# Patient Record
Sex: Female | Born: 1985
Health system: Southern US, Community
[De-identification: ages and names within clinical notes are randomized; demographics above are authoritative.]

## PROBLEM LIST (undated history)

## (undated) DIAGNOSIS — Z720 Tobacco use: Secondary | ICD-10-CM

## (undated) DIAGNOSIS — L509 Urticaria, unspecified: Secondary | ICD-10-CM

## (undated) DIAGNOSIS — D649 Anemia, unspecified: Secondary | ICD-10-CM

## (undated) DIAGNOSIS — F419 Anxiety disorder, unspecified: Secondary | ICD-10-CM

## (undated) HISTORY — DX: Tobacco use: Z72.0

## (undated) HISTORY — DX: Anemia, unspecified: D64.9

## (undated) HISTORY — PX: TYMPANOSTOMY TUBE PLACEMENT: SHX32

## (undated) HISTORY — PX: KNEE SURGERY: SHX244

## (undated) HISTORY — DX: Urticaria, unspecified: L50.9

## (undated) HISTORY — DX: Anxiety disorder, unspecified: F41.9

---

## 2004-10-04 ENCOUNTER — Emergency Department (HOSPITAL_COMMUNITY): Admission: EM | Admit: 2004-10-04 | Discharge: 2004-10-04 | Payer: Self-pay | Admitting: Emergency Medicine

## 2004-11-02 ENCOUNTER — Ambulatory Visit (HOSPITAL_COMMUNITY): Admission: RE | Admit: 2004-11-02 | Discharge: 2004-11-02 | Payer: Self-pay | Admitting: Orthopedic Surgery

## 2004-11-17 ENCOUNTER — Encounter: Admission: RE | Admit: 2004-11-17 | Discharge: 2004-12-08 | Payer: Self-pay | Admitting: Occupational Medicine

## 2005-02-12 ENCOUNTER — Encounter
Admission: RE | Admit: 2005-02-12 | Discharge: 2005-05-13 | Payer: Self-pay | Admitting: Physical Medicine & Rehabilitation

## 2005-02-12 ENCOUNTER — Ambulatory Visit: Payer: Self-pay | Admitting: Physical Medicine & Rehabilitation

## 2005-02-16 ENCOUNTER — Ambulatory Visit: Payer: Self-pay | Admitting: Physical Medicine & Rehabilitation

## 2005-03-20 ENCOUNTER — Ambulatory Visit: Payer: Self-pay | Admitting: Physical Medicine & Rehabilitation

## 2005-05-06 ENCOUNTER — Ambulatory Visit: Payer: Self-pay | Admitting: Physical Medicine & Rehabilitation

## 2005-07-06 ENCOUNTER — Encounter: Admission: RE | Admit: 2005-07-06 | Discharge: 2005-08-03 | Payer: Self-pay | Admitting: Orthopedic Surgery

## 2006-07-02 ENCOUNTER — Ambulatory Visit: Payer: Self-pay | Admitting: Internal Medicine

## 2006-07-02 ENCOUNTER — Ambulatory Visit (HOSPITAL_COMMUNITY): Admission: RE | Admit: 2006-07-02 | Discharge: 2006-07-02 | Payer: Self-pay | Admitting: Internal Medicine

## 2006-07-02 ENCOUNTER — Encounter (INDEPENDENT_AMBULATORY_CARE_PROVIDER_SITE_OTHER): Payer: Self-pay | Admitting: *Deleted

## 2006-07-02 DIAGNOSIS — R059 Cough, unspecified: Secondary | ICD-10-CM | POA: Insufficient documentation

## 2006-07-02 DIAGNOSIS — R930 Abnormal findings on diagnostic imaging of skull and head, not elsewhere classified: Secondary | ICD-10-CM | POA: Insufficient documentation

## 2006-07-02 DIAGNOSIS — Z87891 Personal history of nicotine dependence: Secondary | ICD-10-CM | POA: Insufficient documentation

## 2006-07-02 DIAGNOSIS — R05 Cough: Secondary | ICD-10-CM

## 2006-07-02 LAB — CONVERTED CEMR LAB: Influenza B Ag: NEGATIVE

## 2006-07-28 ENCOUNTER — Emergency Department (HOSPITAL_COMMUNITY): Admission: EM | Admit: 2006-07-28 | Discharge: 2006-07-28 | Payer: Self-pay | Admitting: Emergency Medicine

## 2006-08-03 ENCOUNTER — Inpatient Hospital Stay (HOSPITAL_COMMUNITY): Admission: AD | Admit: 2006-08-03 | Discharge: 2006-08-03 | Payer: Self-pay | Admitting: Obstetrics & Gynecology

## 2006-08-12 ENCOUNTER — Inpatient Hospital Stay (HOSPITAL_COMMUNITY): Admission: AD | Admit: 2006-08-12 | Discharge: 2006-08-12 | Payer: Self-pay | Admitting: Obstetrics & Gynecology

## 2006-09-29 ENCOUNTER — Inpatient Hospital Stay (HOSPITAL_COMMUNITY): Admission: AD | Admit: 2006-09-29 | Discharge: 2006-09-29 | Payer: Self-pay | Admitting: Obstetrics & Gynecology

## 2006-10-21 ENCOUNTER — Ambulatory Visit (HOSPITAL_COMMUNITY): Admission: RE | Admit: 2006-10-21 | Discharge: 2006-10-21 | Payer: Self-pay | Admitting: Obstetrics

## 2006-11-01 ENCOUNTER — Inpatient Hospital Stay (HOSPITAL_COMMUNITY): Admission: AD | Admit: 2006-11-01 | Discharge: 2006-11-01 | Payer: Self-pay | Admitting: Obstetrics

## 2006-12-17 ENCOUNTER — Inpatient Hospital Stay (HOSPITAL_COMMUNITY): Admission: AD | Admit: 2006-12-17 | Discharge: 2006-12-17 | Payer: Self-pay | Admitting: Obstetrics

## 2007-01-18 ENCOUNTER — Inpatient Hospital Stay (HOSPITAL_COMMUNITY): Admission: AD | Admit: 2007-01-18 | Discharge: 2007-01-18 | Payer: Self-pay | Admitting: Obstetrics

## 2007-02-07 ENCOUNTER — Inpatient Hospital Stay (HOSPITAL_COMMUNITY): Admission: AD | Admit: 2007-02-07 | Discharge: 2007-02-07 | Payer: Self-pay | Admitting: Obstetrics

## 2007-02-14 ENCOUNTER — Inpatient Hospital Stay (HOSPITAL_COMMUNITY): Admission: AD | Admit: 2007-02-14 | Discharge: 2007-02-14 | Payer: Self-pay | Admitting: Obstetrics & Gynecology

## 2007-03-19 ENCOUNTER — Inpatient Hospital Stay (HOSPITAL_COMMUNITY): Admission: AD | Admit: 2007-03-19 | Discharge: 2007-03-23 | Payer: Self-pay | Admitting: Obstetrics

## 2007-03-20 ENCOUNTER — Encounter (INDEPENDENT_AMBULATORY_CARE_PROVIDER_SITE_OTHER): Payer: Self-pay | Admitting: Obstetrics

## 2007-07-01 ENCOUNTER — Encounter (INDEPENDENT_AMBULATORY_CARE_PROVIDER_SITE_OTHER): Payer: Self-pay | Admitting: *Deleted

## 2007-07-01 ENCOUNTER — Ambulatory Visit (HOSPITAL_COMMUNITY): Admission: RE | Admit: 2007-07-01 | Discharge: 2007-07-01 | Payer: Self-pay | Admitting: Obstetrics

## 2009-02-08 ENCOUNTER — Emergency Department (HOSPITAL_COMMUNITY): Admission: EM | Admit: 2009-02-08 | Discharge: 2009-02-08 | Payer: Self-pay | Admitting: Family Medicine

## 2009-09-03 ENCOUNTER — Encounter: Payer: Self-pay | Admitting: Cardiovascular Disease

## 2009-09-06 ENCOUNTER — Ambulatory Visit (HOSPITAL_COMMUNITY): Admission: RE | Admit: 2009-09-06 | Discharge: 2009-09-06 | Payer: Self-pay | Admitting: Family Medicine

## 2009-09-06 ENCOUNTER — Encounter: Payer: Self-pay | Admitting: Cardiovascular Disease

## 2009-09-07 ENCOUNTER — Emergency Department (HOSPITAL_BASED_OUTPATIENT_CLINIC_OR_DEPARTMENT_OTHER): Admission: EM | Admit: 2009-09-07 | Discharge: 2009-09-07 | Payer: Self-pay | Admitting: Emergency Medicine

## 2009-09-13 ENCOUNTER — Encounter: Payer: Self-pay | Admitting: Cardiovascular Disease

## 2009-09-17 ENCOUNTER — Ambulatory Visit (HOSPITAL_COMMUNITY): Admission: RE | Admit: 2009-09-17 | Discharge: 2009-09-17 | Payer: Self-pay | Admitting: Family Medicine

## 2009-09-19 ENCOUNTER — Encounter: Payer: Self-pay | Admitting: Cardiovascular Disease

## 2009-09-24 ENCOUNTER — Encounter (INDEPENDENT_AMBULATORY_CARE_PROVIDER_SITE_OTHER): Payer: Self-pay | Admitting: *Deleted

## 2009-09-30 ENCOUNTER — Emergency Department (HOSPITAL_COMMUNITY): Admission: EM | Admit: 2009-09-30 | Discharge: 2009-09-30 | Payer: Self-pay | Admitting: Family Medicine

## 2009-10-15 ENCOUNTER — Ambulatory Visit: Payer: Self-pay | Admitting: Cardiovascular Disease

## 2009-10-15 DIAGNOSIS — I080 Rheumatic disorders of both mitral and aortic valves: Secondary | ICD-10-CM | POA: Insufficient documentation

## 2009-10-16 ENCOUNTER — Telehealth: Payer: Self-pay | Admitting: Cardiovascular Disease

## 2009-11-04 ENCOUNTER — Telehealth: Payer: Self-pay | Admitting: Cardiovascular Disease

## 2009-11-04 ENCOUNTER — Encounter: Payer: Self-pay | Admitting: Cardiovascular Disease

## 2009-11-26 DIAGNOSIS — R109 Unspecified abdominal pain: Secondary | ICD-10-CM | POA: Insufficient documentation

## 2009-11-28 ENCOUNTER — Ambulatory Visit: Payer: Self-pay | Admitting: Internal Medicine

## 2009-11-28 LAB — CONVERTED CEMR LAB
ALT: 11 units/L (ref 0–35)
Amylase: 23 units/L — ABNORMAL LOW (ref 27–131)
Lipase: 8 units/L — ABNORMAL LOW (ref 11.0–59.0)
Total Bilirubin: 0.6 mg/dL (ref 0.3–1.2)

## 2009-12-12 ENCOUNTER — Encounter (INDEPENDENT_AMBULATORY_CARE_PROVIDER_SITE_OTHER): Payer: Self-pay | Admitting: *Deleted

## 2009-12-12 ENCOUNTER — Ambulatory Visit: Payer: Self-pay | Admitting: Internal Medicine

## 2009-12-12 ENCOUNTER — Telehealth: Payer: Self-pay | Admitting: Internal Medicine

## 2009-12-12 ENCOUNTER — Ambulatory Visit (HOSPITAL_COMMUNITY): Admission: RE | Admit: 2009-12-12 | Discharge: 2009-12-12 | Payer: Self-pay | Admitting: Internal Medicine

## 2009-12-12 LAB — CONVERTED CEMR LAB
Albumin: 4 g/dL (ref 3.5–5.2)
Amylase: 36 units/L (ref 27–131)
Bilirubin, Direct: 0.1 mg/dL (ref 0.0–0.3)
Lipase: 37 units/L (ref 11.0–59.0)
Total Protein: 7 g/dL (ref 6.0–8.3)

## 2010-06-10 NOTE — Letter (Signed)
Summary: [facility] Office Note  [facility] Office Note   Imported By: Roderic Ovens 10/30/2009 10:00:29  _____________________________________________________________________  External Attachment:    Type:   Image     Comment:   External Document

## 2010-06-10 NOTE — Letter (Signed)
Summary: [facility] Office Visit Note   [facility] Office Visit Note   Imported By: Roderic Ovens 11/08/2009 11:57:46  _____________________________________________________________________  External Attachment:    Type:   Image     Comment:   External Document

## 2010-06-10 NOTE — Progress Notes (Signed)
Summary: med clearance/**NM  Phone Note Call from Patient Call back at [phone]   Caller: Patient Reason for Call: Talk to Nurse Summary of Call: needs med clearance faxed to Dr Trenton Founds [phone] Initial call taken by: Migdalia Dk,  October 16, 2009 8:13 AM  Follow-up for Phone Call        Spoke with pt. Patient states needs medicine clearance. Dr. Kalman Drape MD  recommends for pt. to take Strattera 30 mg once a day for anxiety. MD would like for Dr. Clifton James to okay medication in writting  on a  prescription  that is [facility] for pt. to take this medication. Note can be fax to [phone]. Ph # [phone].  Pt. would like for med. clearance to be done asap so she can get the medication. Ollen Gross, RN, BSN  October 16, 2009 8:46 AM   Additional Follow-up for Phone Call Additional follow up Details #1::        This should be ok for her to take this medication. Can we fax the note over to the doctors office? thanks, cdm Additional Follow-up by: Verne Carrow, MD,  October 17, 2009 3:02 PM     Appended Document: med clearance/**NM Net was faxed to Dr. Joaquin Bend MD to fax # [phone].

## 2010-06-10 NOTE — Progress Notes (Signed)
Summary: Gallbladder attack  Phone Note Call from Patient Call back at Home Phone [phone]   Call For: DR BRODIE Reason for Call: Talk to Nurse Summary of Call: Had a hida Scan today which caused her to have a gallbladder attack Initial call taken by: Leanor Kail [facility],  December 12, 2009 1:51 PM  Follow-up for Phone Call        Patient with nausea, diarrhea, pain in back, RUQ pain and bloating since having HIDA CCK this am.  Per x-ray report patient had pain during the exam with the CCK.  She has had to leave work due to the pain.    Reviewed with Dr Juanda Chance patient to come for LFT, amylase, lipase.  Patient  can have Vicodin 5/500 1 by mouth q 4 hours as needed pain #20 zero refills.  Left message for patient to call back Follow-up by: Darcey Nora RN, CGRN,  December 12, 2009 2:12 PM  Additional Follow-up for Phone Call Additional follow up Details #1::        patient aware.  She will come for lab this pm, rx faxed to [facility] pharmacy Additional Follow-up by: Darcey Nora RN, CGRN,  December 12, 2009 2:31 PM    New/Updated Medications: HYDROCODONE-ACETAMINOPHEN 5-500 MG TABS (HYDROCODONE-ACETAMINOPHEN) 1 by mouth q 4 hours as needed abdominal pain Prescriptions: HYDROCODONE-ACETAMINOPHEN 5-500 MG TABS (HYDROCODONE-ACETAMINOPHEN) 1 by mouth q 4 hours as needed abdominal pain  #20 x 0   Entered by:   Darcey Nora RN, CGRN   Authorized by:   Hart Carwin MD   Signed by:   Darcey Nora RN, CGRN on 12/12/2009   Method used:   Print then Give to Patient   RxID:   [id number]

## 2010-06-10 NOTE — Assessment & Plan Note (Signed)
Summary: ABD PAIN & SWELLING-ONGOING/YF   History of Present Illness Visit Type: Initial Consult Primary GI MD: Lina Sar MD Primary Provider: Gabriel Cirri, DO Chief Complaint: Abdominal pain, pt states she thinks she is having gallbladder attacks. History of Present Illness:   25 y.o.white female with  3 episodes of severe epigastric and the right costal margin abdominal pain which occurred at night and lasted several hours.all in last 3 months.  Her ultrasound of the gallbladder in April of this year was negative. The common bile duct of 3 mm. Her CT scan of the abdomen and pelvis in February 2009 after a  C-section was normal. She has  a strong family history of gallbladder disease in a  maternal grandmother and 7 paternal uncles and aunts  who had cholecystectomies. Her grandmother just had a HIDA scan which was positive ( but had a normal Ultrasound).. She has no GI history. There has been an intentional 40 pound weight loss in the last 8 months. She had  heartburn during her pregnancy . There is no hx of  dyspepsia or food intolerance   GI Review of Systems    Reports abdominal pain and  bloating.      Denies acid reflux, belching, chest pain, dysphagia with liquids, dysphagia with solids, heartburn, loss of appetite, nausea, vomiting, vomiting blood, weight loss, and  weight gain.        Denies anal fissure, black tarry stools, change in bowel habit, constipation, diarrhea, diverticulosis, fecal incontinence, heme positive stool, hemorrhoids, irritable bowel syndrome, jaundice, light color stool, liver problems, rectal bleeding, and  rectal pain.    Allergies (verified): 1)  ! Buspar  Past History:  Past Medical History: Reviewed history from 10/15/2009 and no changes required. Anxiety Anemia Menstrual dysfunction-heavy menses  Past Surgical History: Reviewed history from 10/15/2009 and no changes required. C section Knee surgery left 2007  Family History: Reviewed  history from 11/26/2009 and no changes required. Significannt for hypertension in both parents DM in maternal grandmother Mother and father alive and healthy 1 sister alive and well 1 brother alive and well 1 sister died an newborn Family History of Heart Disease: Maternal Grandfather Family History of Rectal Cancer: Maternal Grandfather  Social History: Reviewed history from 11/26/2009 and no changes required. Works in [employer] Single, 1 child No tobacco: She used to smoke 4ppd x 10 years, stopped 01/30/09. No alcohol No illicit drug use Daily Caffeine Use: 2 servings daily Patient gets regular exercise.  Review of Systems       The patient complains of anemia and anxiety-new.         Pertinent positive and negative review of systems were noted in the above HPI. All other ROS was otherwise negative.   Vital Signs:  Patient profile:   25 year old female Height:      62 inches Weight:      144 pounds BMI:     26.43 BSA:     1.66 Pulse rate:   80 / minute Pulse rhythm:   irregular BP sitting:   120 / 64  (left arm)  Vitals Entered By: Merri Ray CMA Duncan Dull) (November 28, 2009 9:36 AM)  Physical Exam  General:  Well developed, well nourished, no acute distress. Mouth:  No deformity or lesions, dentition normal. Neck:  Supple; no masses or thyromegaly. Lungs:  Clear throughout to auscultation. Heart:  Regular rate and rhythm; no murmurs, rubs,  or bruits. Abdomen:  soft relaxed abdomen without tenderness  in epigastrium and right upper quadrant. Liver edge at costal margin. No CVA tenderness. Lower abdomen unremarkable. Rectal:  soft Hemoccult negative stool. Extremities:  No clubbing, cyanosis, edema or deformities noted. Skin:  multiple tattoos. Psych:  Alert and cooperative. Normal mood and affect.   Impression & Recommendations:  Problem # 1:  ABDOMINAL PAIN, UNSPECIFIED SITE (ICD-789.00) Patient has had episodes of acute upper abdominal pain suggestive  of biliary colic in the setting of a normal abdominal ultrasound. She has a strong family history of gallbladder disease. We will proceed with a HIDA scan with CCK, liver function tests, amylase and lipase. I am putting her empirically on Prilosec 20 mg daily for suspected gastritis. If the HIDA scan is abnormal, we will refer for surgical consultation. If her HIDA scan is negative, then I would suggest an upper endoscopy and continuation of PPIs. I have instructed her on a low-fat diet. Orders: TLB-Hepatic/Liver Function Pnl (80076-HEPATIC) TLB-Amylase (82150-AMYL) TLB-Lipase (83690-LIPASE) TLB-TSH (Thyroid Stimulating Hormone) (84443-TSH) HIDA CCK (HIDA CCK)  Problem # 2:  Family Hx of RECTAL CANCER (ICD-154.1) Patient has hemoccult-negative stool. She is not a candidate for colonoscopy at this age.  Patient Instructions: 1)  Low-fat diet. 2)  HIDA scan with CCK. 3)  Hepatic function, amylase and lipase,TSH. 4)  If HIDA is negative, we will proceed with an upper endoscopy. 5)  Prilosec 20 mg daily. Samples given for 2 weeks. 6)  Copy sent to : Dr Gabriel Cirri 7)  The medication list was reviewed and reconciled.  All changed / newly prescribed medications were explained.  A complete medication list was provided to the patient / caregiver.

## 2010-06-10 NOTE — Letter (Signed)
Summary: [facility] Office Visit Note   [facility] Office Visit Note   Imported By: Roderic Ovens 11/08/2009 11:56:56  _____________________________________________________________________  External Attachment:    Type:   Image     Comment:   External Document

## 2010-06-10 NOTE — Assessment & Plan Note (Signed)
Summary: np6/ mvp. pt has umr./ gd   Visit Type:  Initial Consult Primary Provider:  Gabriel Cirri, DO  CC:  Hx of mitral valve prolapse.  History of Present Illness: 25 yo WF with history of anxiety who is here today for cardiac evaluation. She was recently on Adderall and had swelling in arms and legs. She was found to have a slight heart murmur and was referred for an echo which showed mild mitral valve prolapse. She has severe anxiety and when having a panic attack, she has chest pressure, SOB and dizziness. No exertional chest pain or SOB.   Current Medications (verified): 1)  Tylenol 325 Mg Tabs (Acetaminophen) .... One Tablet Two Times A Day As Needed For Fever 2)  Ativan 2 Mg Tabs (Lorazepam) .... Take 1 Tablet By Mouth Once A Day  Allergies (verified): No Known Drug Allergies  Past History:  Past Medical History: Anxiety Anemia Menstrual dysfunction-heavy menses  Past Surgical History: C section Knee surgery left 2007  Family History: Significannt for hypertension in both parents DM in grand mother  Mother and father alive and healthy 1 sister alive and well 1 brother alive and well 1 sister died an newborn  Social History: Works in [employer] Single, 1 child No tobacco: She used to smoke 4ppd x 10 years, stopped 01/30/09. No alcohol No illicit drug use  Review of Systems       The patient complains of chest pain, shortness of breath, dizziness, and anxiety.  The patient denies fatigue, malaise, fever, weight gain/loss, vision loss, decreased hearing, hoarseness, palpitations, prolonged cough, wheezing, sleep apnea, coughing up blood, abdominal pain, blood in stool, nausea, vomiting, diarrhea, heartburn, incontinence, blood in urine, muscle weakness, joint pain, leg swelling, rash, skin lesions, headache, fainting, depression, enlarged lymph nodes, easy bruising or bleeding, and environmental allergies.    Vital Signs:  Patient profile:   25 year  old female Height:      62 inches Weight:      150.50 pounds BMI:     27.63 Pulse rate:   74 / minute Pulse rhythm:   regular Resp:     18 per minute BP sitting:   130 / 80  (left arm) Cuff size:   large  Vitals Entered By: Vikki Ports (October 15, 2009 10:51 AM)  Physical Exam  General:  General: Well developed, well nourished, NAD HEENT: OP clear, mucus membranes moist SKIN: warm, dry Neuro: No focal deficits Musculoskeletal: Muscle strength 5/5 all ext Psychiatric: Mood and affect normal Neck: No JVD, no carotid bruits, no thyromegaly, no lymphadenopathy. Lungs:Clear bilaterally, no wheezes, rhonci, crackles CV: RRR with slight systolic  murmur, No gallops rubs Abdomen: soft, NT, ND, BS present Extremities: No edema, pulses 2+.    EKG  Procedure date:  10/15/2009  Findings:      NSR, rate 76 bpm. Normal EKG.   Echocardiogram  Procedure date:  09/17/2009  Findings:      Normal LV size and function. EF 60-65%. Mild mitral regurgitation.   Impression & Recommendations:  Problem # 1:  MITRAL REGURGITATION (ICD-396.3) Mild MR by echo. She has no symptoms that sound cardiac related. Repeat echo one year. Avoid tobacco. F/U one year after echo. I have reassured her about the valvular issue.   Patient Instructions: 1)  Your physician recommends that you schedule a follow-up appointment in: 1 year 2)  Your physician has requested that you have an echocardiogram.  Echocardiography is a painless test that uses  sound waves to create images of your heart. It provides your doctor with information about the size and shape of your heart and how well your heart's chambers and valves are working.  This procedure takes approximately one hour. There are no restrictions for this procedure. To be done in 1 year

## 2010-06-10 NOTE — Progress Notes (Signed)
Summary: pt wants a new Rx asap  Phone Note Call from Patient Call back at Home Phone [phone]   Caller: Patient Reason for Call: Talk to Nurse, Talk to Doctor Summary of Call: pt would like a Rx today for a low dose of aderol Initial call taken by: Omer Jack,  November 04, 2009 10:44 AM  Follow-up for Phone Call        PER PT NEEDS NOTE SAYING OKAY TO TAKE LOW DOSE ADDEROL Scherrie Bateman, LPN  November 04, 2009 11:07 AM  Additional Follow-up for Phone Call Additional follow up Details #1::        I have written a note in the EMR. Can we fax it to her physician? cdm Additional Follow-up by: Verne Carrow, MD,  November 04, 2009 4:11 PM    Additional Follow-up for Phone Call Additional follow up Details #2::    Called pt to see where note should be faxed. Left message to call back. Dossie Arbour, RN, BSN  November 05, 2009 6:04 PM Pt. notified letter has been written. Per pt. note needs to be sent to Dr. Ian Bushman. Fax nimber [phone] Dossie Arbour, RN, BSN  November 06, 2009 8:59 AM Note faxed Follow-up by: Dossie Arbour, RN, BSN,  November 06, 2009 2:11 PM

## 2010-06-10 NOTE — Letter (Signed)
Summary: New Patient letter  [facility]  [address] [place], [place] [address]   Phone: [phone]  Fax: [phone]       09/24/2009 MRN: [id number]  [facility] [address] [place], [place]  [address]  Dear Ms. Charlesetta Shanks,  Welcome to the Gastroenterology Division at [facility].    You are scheduled to see Dr.  Juanda Chance on JULY 21,2011 at 9:15am on the 3rd floor at [facility], [address]  We ask that you try to arrive at our office 15 minutes prior to your appointment time to allow for check-in.  We would like you to complete the enclosed self-administered evaluation form prior to your visit and bring it with you on the day of your appointment.  We will review it with you.  Also, please bring a complete list of all your medications or, if you prefer, bring the medication bottles and we will list them.  Please bring your insurance card so that we may make a copy of it.  If your insurance requires a referral to see a specialist, please bring your referral form from your primary care physician.  Co-payments are due at the time of your visit and may be paid by cash, check or credit card.     Your office visit will consist of a consult with your physician (includes a physical exam), any laboratory testing he/she may order, scheduling of any necessary diagnostic testing (e.g. x-ray, ultrasound, CT-scan), and scheduling of a procedure (e.g. Endoscopy, Colonoscopy) if required.  Please allow enough time on your schedule to allow for any/all of these possibilities.    If you cannot keep your appointment, please call [phone] to cancel or reschedule prior to your appointment date.  This allows [place] the opportunity to schedule an appointment for another patient in need of care.  If you do not cancel or reschedule by 5 p.m. the business day prior to your appointment date, you will be charged a $50.00 late cancellation/no-show fee.    Thank you for choosing  [facility] for your medical needs.  We appreciate the opportunity to care for you.  Please visit [place] at our website  to learn more about our practice.                     Sincerely,                                                             The Gastroenterology Division

## 2010-06-10 NOTE — Letter (Signed)
Summary: [facility] Office Note  [facility] Office Note   Imported By: Roderic Ovens 10/30/2009 09:59:50  _____________________________________________________________________  External Attachment:    Type:   Image     Comment:   External Document

## 2010-06-10 NOTE — Letter (Signed)
Summary: [facility] Office Visit Note   [facility] Office Visit Note   Imported By: Roderic Ovens 11/08/2009 11:57:28  _____________________________________________________________________  External Attachment:    Type:   Image     Comment:   External Document

## 2010-06-10 NOTE — Letter (Signed)
Summary: [facility] Office Visit Note   [facility] Office Visit Note   Imported By: Roderic Ovens 12/06/2009 15:51:47  _____________________________________________________________________  External Attachment:    Type:   Image     Comment:   External Document

## 2010-06-10 NOTE — Letter (Signed)
Summary: [facility] Office Note  [facility] Office Note   Imported By: Roderic Ovens 10/30/2009 10:01:26  _____________________________________________________________________  External Attachment:    Type:   Image     Comment:   External Document

## 2010-06-10 NOTE — Letter (Signed)
Summary: Generic Letter  [facility], Main Office  [address]   [place], [place] [address]   Phone: [phone]  Fax: [phone]        November 04, 2009 MRN: [id number]    [facility] [address] [place], [place]  [address]    Tanya Powers was seen recently in our office. She has mild mitral valve prolapse on echocardiogram with no other cardiac abnormalities. There is no cardiac contraindication to the use of Adderall in this patient.    Sincerely,  Verne Carrow, MD  This letter has been electronically signed by your physician.

## 2010-07-29 LAB — POCT CARDIAC MARKERS
CKMB, poc: <1 ng/mL — ABNORMAL LOW (ref 1.0–8.0)
Myoglobin, poc: 37.3 ng/mL (ref 12–200)

## 2010-07-29 LAB — CBC
RBC: 5.25 MIL/uL — ABNORMAL HIGH (ref 3.87–5.11)
WBC: 8.8 10*3/uL (ref 4.0–10.5)

## 2010-07-29 LAB — URINALYSIS, ROUTINE W REFLEX MICROSCOPIC
Nitrite: NEGATIVE
Specific Gravity, Urine: 1.012 (ref 1.005–1.030)
pH: 6 (ref 5.0–8.0)

## 2010-07-29 LAB — PREGNANCY, URINE: Preg Test, Ur: NEGATIVE

## 2010-07-29 LAB — BASIC METABOLIC PANEL
Calcium: 9 mg/dL (ref 8.4–10.5)
Chloride: 104 mEq/L (ref 96–112)
Creatinine, Ser: 0.6 mg/dL (ref 0.4–1.2)
GFR calc Af Amer: >60 mL/min (ref >60)
GFR calc non Af Amer: >60 mL/min (ref >60)

## 2010-09-23 NOTE — H&P (Signed)
NAMEALLORA, BAINS             ACCOUNT NO.:  [id number]   MEDICAL RECORD NO.:  [id number]          PATIENT TYPE:  INP   LOCATION:  9320                          FACILITY:  [facility]   PHYSICIAN:  Kathreen Cosier, M.D.DATE OF BIRTH:  May 20, 1985   DATE OF ADMISSION:  03/19/2007  DATE OF DISCHARGE:                              HISTORY & PHYSICAL   The patient is a 25 year old, gravida 1, EDC March 22, 2007, who was  seen at 4:30 a.m. in labor.  Initially, she had some elevated blood  pressures and her PIH labs were normal and by the time she was in labor  and delivery her blood pressure was normal.  Her cervix was 1-cm, 90%,  and the vertex was at a minus 3 station.  Amniotomy was performed.  No  fluid was obtained.  Thick meconium was noted and it was noted that she  was having some spontaneous variable decels which recovered rapidly.  An  IUPC was inserted and she was contracting every 3 minutes.  She was then  examined by the nurse at 5:18 a.m. and was 2-cm, 90%, with a vertex  minus 3.  Next exam was at 8:15 a.m. and at that time the nurse examined  the patient she was fully dilated with a breech presentation at a minus  1 station.  Throughout the morning she had occasional variable decels  and an otherwise reactive tracing.  She had amnio infusion begun at 5:33  a.m. because of meconium.  It was decided that the patient would deliver  by C-section because of breech presentation and in labor.   PHYSICAL EXAMINATION:  GENERAL:  Revealed a well developed female in  labor.  HEENT:  Negative.  LUNGS:  Clear.  HEART:  Regular rhythm.  No murmurs or gallops.  BREASTS:  No masses.  ABDOMEN:  Term size with an estimated fetal weight of 6 pounds and 12  ounces.  LEGS:  Negative.           ______________________________  Kathreen Cosier, M.D.     BAM/MEDQ  D:  03/20/2007  T:  03/20/2007  Job:  [id number]

## 2010-09-23 NOTE — Op Note (Signed)
Tanya Powers, Tanya Powers             ACCOUNT NO.:  [id number]   MEDICAL RECORD NO.:  [id number]          PATIENT TYPE:  INP   LOCATION:  9320                          FACILITY:  [facility]   PHYSICIAN:  Kathreen Cosier, M.D.DATE OF BIRTH:  03-24-1986   DATE OF PROCEDURE:  03/20/2007  DATE OF DISCHARGE:                               OPERATIVE REPORT   PREOPERATIVE DIAGNOSIS:  Breech presentation, fully dilated at term.   POSTOPERATIVE DIAGNOSIS:  Breech presentation, fully dilated at term,  intrauterine growth retardation.   ANESTHESIA:  Epidural.   SURGEON:  Kathreen Cosier, M.D.   PROCEDURE:  The patient placed on the operating table in supine  position.  Abdomen prepped and draped.  Bladder emptied with Foley  catheter.  Transverse suprapubic incision made, carried down to rectus  fascia.  Fascia cleaned and incised the length of incision.  Recti  muscles retracted laterally.  Peritoneum incised longitudinally.  Transverse incision made in the visceral peritoneum above the bladder.  Bladder mobilized inferiorly.  Transverse lower uterine incision made  and the patient delivered of a frank breech female.  There was a loose  cord around the right calf and the team was in attendance.  Apgar scores  were 2, 6, 7 and the cord pH was 7.23 and the baby weight 4 pounds 8  ounces.  The placenta was anterior, removed manually and sent to  pathology.  The uterine cavity cleaned with dry laps.  The uterine  incision closed with continuous suture of #1 chromic in one layer.  Hemostasis satisfactory.  Bladder flap reattached with 2-0 chromic.  Uterus well contracted.  Tubes and ovaries normal.  Abdomen closed in  layers, peritoneum continuous suture of 0 chromic, fascia continuous  suture of 0 Dexon and the skin closed with subcuticular stitch of 4-0  Monocryl.  Blood loss 600 mL.  The patient taken to recovery room in  good condition.           ______________________________  Kathreen Cosier, M.D.     BAM/MEDQ  D:  03/20/2007  T:  03/21/2007  Job:  [id number]

## 2010-09-26 NOTE — Discharge Summary (Signed)
Tanya Powers, Tanya Powers             ACCOUNT NO.:  [id number]   MEDICAL RECORD NO.:  [id number]          PATIENT TYPE:  INP   LOCATION:  9320                          FACILITY:  [facility]   PHYSICIAN:  Charles A. Clearance Coots, M.D.DATE OF BIRTH:  06-09-1985   DATE OF ADMISSION:  03/19/2007  DATE OF DISCHARGE:  03/23/2007                               DISCHARGE SUMMARY   ADMITTING DIAGNOSIS:  Term pregnancy, early labor.   DISCHARGE DIAGNOSIS:  Term pregnancy, early labor, status post primary  low transverse cesarean section for breech presentation in active labor.  A viable female infant was delivered March 20, 2007, at 0846 hours,  Apgar's 2, 6 and 7 at one, five and ten minutes.  Weight of 2063 g,  length of 47 cm.   CONDITION ON DISCHARGE:  The mother and infant discharged home in good  condition.   REASON FOR ADMISSION:  A 25 year old G1, estimated date of confinement  of March 22, 2007, presented with uterine contractions.   PRENATAL COURSE:  Prenatal care was uncomplicated.   PAST MEDICAL HISTORY:  None.   PAST SURGICAL HISTORY:  None.   MEDICATIONS:  Prenatal vitamins.   ALLERGIES:  NO KNOWN DRUG ALLERGIES   SOCIAL HISTORY:  Single.  Positive tobacco, negative alcohol or  recreational drug use.   PHYSICAL EXAMINATION:  GENERAL:  A well-nourished, well-developed female  in no acute distress.  VITAL SIGNS:  Afebrile, vital signs stable.  LUNGS:  Clear to auscultation bilaterally.  HEART:  Regular rate and rhythm.  ABDOMEN:  Gravid, nontender.  Cervix 1 cm dilated, 90% effaced and the  presenting part was a -3 station.   LABORATORY DATA AND X-RAY FINDINGS:  On admission, hemoglobin 12,  hematocrit 35.6, white blood cell count 14,000, platelets 310,000.   HOSPITAL COURSE:  The patient was admitted and on further examination,  the cervix was found to have a breech presentation.  She was taken to  the operating room and a primary low transverse cesarean section was  performed without complications.  Postoperative course was  uncomplicated.  The patient was discharged home on postop day #3 in good  condition.   DISCHARGE LABORATORY VALUES:  Hemoglobin 9, hematocrit 27, white blood  cell count 10,000, platelets 272,000.   DISCHARGE MEDICATIONS:  Tylox and ibuprofen was prescribed for pain.  Continue prenatal vitamins.   SPECIAL INSTRUCTIONS:  Routine written instructions were given for  discharge after cesarean section.  The patient is to call office for a  followup appointment in 2 weeks.      Charles A. Clearance Coots, M.D.  Electronically Signed     CAH/MEDQ  D:  04/15/2007  T:  04/16/2007  Job:  [id number]

## 2010-09-26 NOTE — Assessment & Plan Note (Signed)
INTERVAL HISTORY:  Tanya Powers returns to clinic today accompanied by her  mother.  The patient reports that she was unable to tolerate the Lyrica at  75 mg daily.  She reports that she became nauseated with the medication.  She was unable to tolerate tramadol in the past and also reports that she  gained no benefit from anti-inflammatory medications.   The patient has been in touch with her workers' comp carrier.  Apparently a  rehab nurse is being assigned to her case.  The case manager that she talked  to apparently told her that she needed to be released from this office so  that she could get a second opinion with Dr. Charlann Boxer, local orthopedist.  She  had a bad experience with Dr. Chaney Malling for evaluation of her left knee  pain.  She would like to see Dr. Charlann Boxer for a second opinion and possibly have  arthroscopy as had been suggested by Dr. Chaney Malling.   The patient would like to have some pain medicine for her most severe pain  which is present especially in the evening hours.   MEDICATIONS:  None.   REVIEW OF SYSTEMS:  Noncontributory.   PHYSICAL EXAMINATION:  Well-appearing fit adult female in mild acute  discomfort.  Blood pressure 126/44 with a pulse of 83, respiratory rate 16  and O2 saturation 99% on room air.  The patient has minimal swelling present  on the left knee region.  She has fairly good strength at least 4+/5 with  slight pain with range of motion.   IMPRESSION:  Persistent left medial and anterior knee pain.   At the present time we have given the patient a prescription for hydrocodone  5/325 one to two tablets p.o. twice a day p.r.n. a total of 90 to be used  for her most severe pain.  We have also released her as requested by the  workers' comp carrier so that she can be set up for a second opinion with  Dr. Charlann Boxer.  If Dr. Charlann Boxer plans surgery and that is successful then we will not  anticipate any return visits.  If he is not anticipating surgery or surgery  is  unsuccessful then she probably will need some followup in this office.  We will wait and see what the future holds in terms of his evaluation with  the patient.  In any event she will have the hydrocodone to be used for the  most severe pain until she is seen by Dr. Charlann Boxer in the near future.           ______________________________  Ellwood Dense, M.D.     DC/MedQ  D:  05/08/2005 15:22:50  T:  05/09/2005 15:41:59  Job #:  [id number]

## 2010-09-26 NOTE — Group Therapy Note (Signed)
REFERRAL:  Rodney A. Chaney Powers, M.D.   PURPOSE OF EVALUATION:  Evaluate and treat left knee pain.   HISTORY OF PRESENT ILLNESS:  Tanya Powers is a 25 year old adult female  referred to this office by Tanya Powers for evaluation of left knee pain.  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  She reports that she was trying to  help a patient get into a wheelchair when she twisted her left knee.  She  apparently went to the emergency room at that time and x-rays were normal.   Over the next several days to a week, the patient was treated at  [facility] by Tanya Powers.  She reports that she was able to  eventually get in to see an orthopedist, and that was with Tanya Powers.   October 31, 2004, Tanya Powers saw the patient and noted that x-rays were  normal.  He requested an MRI scan and then made a diagnosis of left medial  knee joint pain after a twisting injury at work.   November 02, 2004, an MRI scan of her left knee was read as normal.   November 12, 2004, the patient followed up with Tanya Powers and had persistent  pain over the medial aspect of her left knee.  She was asked to start  physical therapy and did so.  She was allowed a work capacity by Tanya Powers.  The patient reports that she did attend physical therapy for approximately  12-14 visits and that actually made her pain worse.  They were also using  electrical stimulation on her left knee.   The patient reports that shortly after the time of her injury she was  allowed light duty and that continued Oct 04, 2004, through December 08, 2004.  She reports that she was actually doing more in the light duty than she was  supposed to do and that there was some conflict between her and her  supervisors at the extended care facility.  Tanya Powers was subsequently  confused about her clinical course and also apparently somewhat frustrated  by her lack of  progress.  He had seen her December 08, 2004, and said that she  was totally disabled at that time.  He appeared to be somewhat frustrated  with also the lack of adherence to the work restrictions.  He made a  referral for the patient to come to this pain clinic at that time.   Presently the patient complains of pain only of her left knee both on the  anterior and medial surface.  She reports that there is sharp pain present  on the medial, anterior and lateral aspect of her left knee but most of the  swelling is present over the medial and anterior surface.  She complains of  tingling pain of her left thigh down to her toes.  She reports that she has  had weakness and has had falls involving give-away of her left leg during  ambulation.  She is not using any device at this time but reports that she  previously used crutches, but the caused more problems than helping.   PAST MEDICAL HISTORY:  Noncontributory.   ALLERGIES:  No known drug allergies.   FAMILY HISTORY:  Positive for diabetes, heart disease and hypertension.   MEDICATIONS:  Only p.r.n. use of Tylenol and Aleve.   SOCIAL HISTORY:  The patient is a Psychologist, sport and exercise  and lives with her family.  She  smokes one-half pack of cigarettes per day.  Denies alcohol usage.  She is  working on her R.N. degree and has two years remaining before she can get  that degree.  She was working up through December 08, 2004, at light duty.   PHYSICAL EXAMINATION:  GENERAL:  A well-appearing fit adult female in mild  to no acute discomfort.  Blood pressure 123/66 with a pulse of 91,  respiratory rate 16, and O2 saturation 98% on room air.  She is examined in  the room with her mother present.  MUSCULOSKELETAL/NEUROLOGIC:  Examination of the left lower extremity showed  no crepitus to range of motion.  There was mild swelling on the left  compared to the right with a diameter on the right of 38 cm and on the left  of 40 cm.  The patient had good strength on  the bilateral at 5-/5.  Bulk and  tone were normal.  Reflexes were 2+ and symmetrical bilaterally.  Upper  extremity exam was unremarkable.  The patient ambulates without any  assistive device, without any particular limp.   IMPRESSION:  Persistent left medial and anterior knee pain.   At the present time we have requested EMG nerve conduction studies of her  left lower extremity.  We do not need to look in the lumbar region as I do  not think there are any significant abnormalities that will account to her  problem according to the history of her injury.  She has been told that we  will try to get this study over the next several days.  We have also given  her samples of Lyrica 75 mg to use one tablet daily.  Hopefully, this will  give her some relief from the pain that she has been reporting.  We have  also recommended that she not return to work at the present time until we at  least have a chance to get these studies.  Will plan on seeing her in follow-  up in approximately one month's time to go over the studies and see how she  has responded to the Lyrica.           ______________________________  Tanya Powers, M.D.     DC/MedQ  D:  02/13/2005 16:33:31  T:  02/14/2005 08:06:36  Job #:  [id number]

## 2010-09-26 NOTE — Assessment & Plan Note (Signed)
DATE OF EVALUATION:  March 23, 2005.   HISTORY:  Tanya Powers returns to the clinic today for followup evaluation  accompanied by her mother.  We first and last saw the patient in this  office, February 13, 2005, on a referral from Dr. Chaney Malling for chronic left  knee pain.  During the initial visit, we had started her on Lyrica at 75 mg  daily.  She reports that she was taking that at night and it did give her  some relief and allowed her to sleep.  She reports that she has had less  relief as she has used that medication for a longer period of time.  She has  not experienced any bad side effects on the medication.   The patient did undergo EMG nerve conduction studies at our request, and  those were done February 16, 2005.  The impression was a normal EMG nerve  conduction study of the left lower extremity without evidence of peroneal  neuropathy or radiculopathy.   The patient has been advised of those results in the office today.  She has  had a history of MRI scan of the left knee done, November 02, 2004, which was  read as normal.  She does not remember how far they went down on the leg.  She does complain of pain at the anterior portion of her tibia just about  one-third down from her knee.  This seems to be painful for her in addition  to the areas around her medial and posterior knee on the left side.  She  reports occasional give-out weakness of her left leg when she is getting out  of bed in the morning.  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   MEDICATIONS:  Lyrica 75 mg daily.   REVIEW OF SYSTEMS:  Noncontributory.   PHYSICAL EXAMINATION:  GENERAL:  Well-appearing, thin, adult female in mild,  acute discomfort.  VITAL SIGNS:  Blood pressure 113/65 with a pulse of 90, respiratory rate 16,  and O2 saturation 99% on room air.   She complains of pinpoint tenderness over the upper third of her tibia  on  the left side in the anterior area.  She also complains of pain in the  medial aspect of her left knee, especially with range of motion.  There is  no substantial swelling of her knee, although there is slight minimal  swelling over that anterior portion of her tibia.  She ambulates without any  assistive device in the office today.   In the office today, we did ask the patient to increase her Lyrica to 75 mg  twice a day, and I gave her a prescription for that increase.  At this  point, there is no sign of radiculopathy or a peroneal neuropathy involving  her left lower extremity.  She is interested in having the arthroscopy done  but is not interested in having Dr. Chaney Malling perform it.  Her mother has  checked with other people, and they would like to have the arthroscopy done  by Dr. Charlann Boxer.  They are interested in having Worker's Compensation refer Tanya  Tanya Powers to Dr. Charlann Boxer to have an evaluation and a probable arthroscopy on the  left knee.  That should be reasonable as Dr. Chaney Malling was the [employer] carrier's choice to begin with.  The patient has not actually  had a second opinion at this point and  is interested in having Dr. Charlann Boxer both  give a second opinion and do an arthroscopy unless he feels that that is not  appropriate.  She seems to have failed conservative treatment at this point,  and probably an arthroscopy is reasonable.  Dr. Chaney Malling had previously  suggested that possibly the MRI scan was a false-negative when it was done  in June of 2006.   We will plan on seeing the patient in followup in this office in  approximately six weeks time to see how she has done with the Lyrica and see  if any progress has been made with the referral to Dr. Charlann Boxer.           ______________________________  Ellwood Dense, M.D.     DC/MedQ  D:  03/23/2005 10:35:02  T:  03/23/2005 21:03:34  Job #:  [id number]

## 2010-10-29 ENCOUNTER — Encounter: Payer: Self-pay | Admitting: Cardiovascular Disease

## 2010-10-30 ENCOUNTER — Ambulatory Visit (INDEPENDENT_AMBULATORY_CARE_PROVIDER_SITE_OTHER): Payer: Medicare HMO | Admitting: Cardiovascular Disease

## 2010-10-30 ENCOUNTER — Encounter: Payer: Self-pay | Admitting: Cardiovascular Disease

## 2010-10-30 VITALS — BP 140/70 | HR 74 | Resp 14 | Ht 62.0 in | Wt 153.0 lb

## 2010-10-30 DIAGNOSIS — I059 Rheumatic mitral valve disease, unspecified: Secondary | ICD-10-CM

## 2010-10-30 DIAGNOSIS — I080 Rheumatic disorders of both mitral and aortic valves: Secondary | ICD-10-CM

## 2010-10-30 DIAGNOSIS — I340 Nonrheumatic mitral (valve) insufficiency: Secondary | ICD-10-CM

## 2010-10-30 NOTE — Progress Notes (Signed)
History of Present Illness:25 yo WF with history of anxiety who is here today for cardiac follow up. I saw her one year ago for evaluation of chest pressure and murmur. She had been on Adderall and had swelling in arms and legs. She was found to have a slight heart murmur. Echo 09/17/09 with normal LV size and function. EF 60-65%. Mild mitral regurgitation.  She has been off of Adderall and feeling much better. Anxiety is better on Wellbutrin. No chest pain or pressure. No other complaints.    Past Medical History  Diagnosis Date  . Anxiety   . Anemia   . Menstrual bleeding problem     dysfunction. heavy menses    Past Surgical History  Procedure Date  . Cesarean section   . Knee surgery     L 2007    Current Outpatient Prescriptions  Medication Sig Dispense Refill  . acetaminophen (TYLENOL) 325 MG tablet Take 650 mg by mouth 2 (two) times daily as needed. For fever       . ACETAMINOPHEN-BUTALBITAL (BUPAP) 50-650 MG TABS Take by mouth as needed.        Marland Kitchen buPROPion (WELLBUTRIN) 75 MG tablet Take 75 mg by mouth daily.        . clonazePAM (KLONOPIN) 1 MG tablet Take 1 mg by mouth as needed.        Marland Kitchen HYDROcodone-acetaminophen (VICODIN) 5-500 MG per tablet Take 1 tablet by mouth every 4 (four) hours as needed.          Allergies  Allergen Reactions  . Buspirone Hcl     History   Social History  . Marital Status: Single    Spouse Name: N/A    Number of Children: N/A  . Years of Education: N/A   Occupational History  . Not on file.   Social History Main Topics  . Smoking status: Former Games developer  . Smokeless tobacco: Not on file   Comment: smoked 4 ppd x10 yrs stopped 01/30/09.   Marland Kitchen Alcohol Use: No  . Drug Use: No  . Sexually Active: Not on file   Other Topics Concern  . Not on file   Social History Narrative   Single, 1 child. Works in [employer] caffeine - 2 servings per dayGets regular exercise.     Family History  Problem Relation Age of Onset  .  Hypertension      both parents (who are otherwise alive and well)  . Diabetes Maternal Grandmother   . Heart disease Maternal Grandfather     also had rectal cancer     Review of Systems:  As stated in the HPI and otherwise negative.   BP 140/70  Pulse 74  Resp 14  Ht 5\' 2"  (1.575 m)  Wt 153 lb (69.4 kg)  BMI 27.98 kg/m2  Physical Examination: General: Well developed, well nourished, NAD HEENT: OP clear, mucus membranes moist SKIN: warm, dry. No rashes. Neuro: No focal deficits Musculoskeletal: Muscle strength 5/5 all ext Psychiatric: Mood and affect normal Neck: No JVD, no carotid bruits, no thyromegaly, no lymphadenopathy. Lungs:Clear bilaterally, no wheezes, rhonci, crackles Cardiovascular: Regular rate and rhythm. No murmurs, gallops or rubs. Abdomen:Soft. Bowel sounds present. Non-tender.  Extremities: No lower extremity edema.   EKG:NSR, rate 77 bpm.

## 2010-10-30 NOTE — Assessment & Plan Note (Signed)
Mild MR on echo last year. Will get f/u echo this month. If no progression, no further workup. PRN follow up.

## 2010-11-10 ENCOUNTER — Other Ambulatory Visit (HOSPITAL_COMMUNITY): Payer: Medicare HMO | Admitting: Radiology

## 2011-02-17 LAB — URINALYSIS, ROUTINE W REFLEX MICROSCOPIC
Bilirubin Urine: NEGATIVE
Glucose, UA: NEGATIVE
Ketones, ur: NEGATIVE
Ketones, ur: NEGATIVE
Nitrite: NEGATIVE
Protein, ur: NEGATIVE
Protein, ur: NEGATIVE
Urobilinogen, UA: 0.2
Urobilinogen, UA: 0.2

## 2011-02-17 LAB — CBC
HCT: 27.1 — ABNORMAL LOW
HCT: 28.1 — ABNORMAL LOW
Hemoglobin: 9 — ABNORMAL LOW
MCHC: 33.8
MCV: 79.3
MCV: 79.7
Platelets: 231
RBC: 3.55 — ABNORMAL LOW
RBC: 4.53
RDW: 16.4 — ABNORMAL HIGH
WBC: 10.6 — ABNORMAL HIGH
WBC: 14.3 — ABNORMAL HIGH

## 2011-02-17 LAB — DIFFERENTIAL
Basophils Absolute: 0
Eosinophils Absolute: 0.2
Eosinophils Relative: 2
Lymphocytes Relative: 21
Lymphs Abs: 2.2
Monocytes Absolute: 0.4

## 2011-02-17 LAB — COMPREHENSIVE METABOLIC PANEL
ALT: 11
AST: 17
BUN: 5 — ABNORMAL LOW
CO2: 23
CO2: 29
Calcium: 8 — ABNORMAL LOW
Calcium: 8.6
Chloride: 107
Creatinine, Ser: 0.58
GFR calc Af Amer: >60
GFR calc non Af Amer: >60
GFR calc non Af Amer: >60
Sodium: 137
Total Bilirubin: 0.4

## 2011-02-17 LAB — URINE MICROSCOPIC-ADD ON

## 2011-02-17 LAB — URIC ACID: Uric Acid, Serum: 6.4

## 2011-02-17 LAB — RPR: RPR Ser Ql: NONREACTIVE

## 2011-02-19 LAB — URINE MICROSCOPIC-ADD ON

## 2011-02-19 LAB — URINALYSIS, ROUTINE W REFLEX MICROSCOPIC
Glucose, UA: NEGATIVE
Hgb urine dipstick: NEGATIVE
Hgb urine dipstick: NEGATIVE
Protein, ur: NEGATIVE
Specific Gravity, Urine: 1.02
Urobilinogen, UA: 0.2
Urobilinogen, UA: 0.2
pH: 6

## 2011-02-20 LAB — URINALYSIS, ROUTINE W REFLEX MICROSCOPIC
Glucose, UA: NEGATIVE
Hgb urine dipstick: NEGATIVE
Ketones, ur: NEGATIVE
Protein, ur: NEGATIVE

## 2011-02-20 LAB — WET PREP, GENITAL: Clue Cells Wet Prep HPF POC: NONE SEEN

## 2011-02-23 LAB — COMPREHENSIVE METABOLIC PANEL
ALT: 13
AST: 16
CO2: 27
Chloride: 104
GFR calc Af Amer: >60
GFR calc non Af Amer: >60
Sodium: 137
Total Bilirubin: 0.5

## 2011-02-23 LAB — URINALYSIS, ROUTINE W REFLEX MICROSCOPIC
Nitrite: NEGATIVE
Specific Gravity, Urine: 1.02
Urobilinogen, UA: 0.2

## 2011-02-23 LAB — CBC
RBC: 3.9
WBC: 12 — ABNORMAL HIGH

## 2011-02-23 LAB — DIFFERENTIAL
Band Neutrophils: 5
Blasts: 0
Eosinophils Relative: 1
Metamyelocytes Relative: 0
Myelocytes: 0
nRBC: 1 — ABNORMAL HIGH

## 2011-02-23 LAB — URIC ACID: Uric Acid, Serum: 4.2

## 2011-02-25 LAB — URINALYSIS, ROUTINE W REFLEX MICROSCOPIC
Bilirubin Urine: NEGATIVE
Nitrite: NEGATIVE
Specific Gravity, Urine: 1.025
pH: 6

## 2011-02-25 LAB — CBC
Hemoglobin: 12.5
MCHC: 33.4
RBC: 4.51
WBC: 13.2 — ABNORMAL HIGH

## 2011-02-25 LAB — WET PREP, GENITAL
Clue Cells Wet Prep HPF POC: NONE SEEN
Trich, Wet Prep: NONE SEEN
Yeast Wet Prep HPF POC: NONE SEEN

## 2011-04-23 ENCOUNTER — Emergency Department (HOSPITAL_BASED_OUTPATIENT_CLINIC_OR_DEPARTMENT_OTHER)
Admission: EM | Admit: 2011-04-23 | Discharge: 2011-04-23 | Disposition: A | Discharge status: Home / Self Care | Payer: Managed Care, Other (non HMO) | Attending: Emergency Medicine | Admitting: Emergency Medicine

## 2011-04-23 ENCOUNTER — Encounter (HOSPITAL_BASED_OUTPATIENT_CLINIC_OR_DEPARTMENT_OTHER): Payer: Self-pay

## 2011-04-23 DIAGNOSIS — H9209 Otalgia, unspecified ear: Secondary | ICD-10-CM | POA: Insufficient documentation

## 2011-04-23 DIAGNOSIS — J020 Streptococcal pharyngitis: Secondary | ICD-10-CM | POA: Insufficient documentation

## 2011-04-23 LAB — RAPID STREP SCREEN (MED CTR MEBANE ONLY): Streptococcus, Group A Screen (Direct): NEGATIVE

## 2011-04-23 MED ORDER — OXYCODONE-ACETAMINOPHEN 5-325 MG PO TABS: 2.0000 | ORAL_TABLET | ORAL | Status: AC | PRN

## 2011-04-23 MED ORDER — PENICILLIN V POTASSIUM 500 MG PO TABS: 500.0000 mg | ORAL_TABLET | Freq: Four times a day (QID) | ORAL | Status: AC

## 2011-04-23 NOTE — ED Provider Notes (Signed)
History     CSN: [id number] Arrival date & time: 04/23/2011  4:17 PM   First MD Initiated Contact with Patient 04/23/11 1632      Chief Complaint  Patient presents with  . Otalgia  . Sore Throat    (Consider location/radiation/quality/duration/timing/severity/associated sxs/prior treatment) Patient is a 25 y.o. female presenting with pharyngitis. The history is provided by the patient. No language interpreter was used.  Sore Throat This is a new problem. The current episode started in the past 7 days. The problem occurs constantly. The problem has been gradually worsening. Associated symptoms include a fever, neck pain, a sore throat and swollen glands. The symptoms are aggravated by drinking and eating. She has tried NSAIDs for the symptoms. The treatment provided no relief.  Pt complains of pain in both ears and in her throat.   Past Medical History  Diagnosis Date  . Anxiety   . Anemia   . Menstrual bleeding problem     dysfunction. heavy menses    Past Surgical History  Procedure Date  . Cesarean section   . Knee surgery     L 2007    Family History  Problem Relation Age of Onset  . Hypertension      both parents (who are otherwise alive and well)  . Diabetes Maternal Grandmother   . Heart disease Maternal Grandfather     also had rectal cancer     History  Substance Use Topics  . Smoking status: Current Everyday Smoker  . Smokeless tobacco: Not on file   Comment: smoked 4 ppd x10 yrs stopped 01/30/09.   Marland Kitchen Alcohol Use: No    OB History    Grav Para Term Preterm Abortions TAB SAB Ect Mult Living                  Review of Systems  Constitutional: Positive for fever.  HENT: Positive for sore throat and neck pain.   All other systems reviewed and are negative.    Allergies  Review of patient's allergies indicates no active allergies.  Home Medications   Current Outpatient Rx  Name Route Sig Dispense Refill  . IBUPROFEN 200 MG PO TABS Oral Take  800 mg by mouth every 6 (six) hours as needed. For pain       BP 132/71  Pulse 89  Temp(Src) 98.8 F (37.1 C) (Oral)  Resp 16  Ht 5\' 2"  (1.575 m)  Wt 146 lb (66.225 kg)  BMI 26.70 kg/m2  SpO2 100%  LMP 04/11/2011  Physical Exam  Nursing note and vitals reviewed. Constitutional: She is oriented to person, place, and time. She appears well-developed and well-nourished.  HENT:  Head: Normocephalic and atraumatic.  Right Ear: External ear normal.  Nose: Nose normal.  Mouth/Throat: Oropharyngeal exudate present.  Eyes: Conjunctivae and EOM are normal. Pupils are equal, round, and reactive to light.  Neck: Normal range of motion. Neck supple.  Cardiovascular: Normal rate and normal heart sounds.   Pulmonary/Chest: Effort normal.  Abdominal: Soft.  Musculoskeletal: Normal range of motion.  Neurological: She is alert and oriented to person, place, and time.  Skin: Skin is warm.  Psychiatric: She has a normal mood and affect.    ED Course  Procedures (including critical care time)   Labs Reviewed  RAPID STREP SCREEN   No results found.   No diagnosis found.    MDM  Strep negative,  Pt given rx for pcn and hydrocodone.  Langston Masker, [place] 04/23/11 Rickey Primus

## 2011-04-23 NOTE — ED Notes (Signed)
Sore throat, bilat ear ache x 2 days

## 2011-04-23 NOTE — ED Provider Notes (Signed)
Medical screening examination/treatment/procedure(s) were performed by non-physician practitioner and as supervising physician I was immediately available for consultation/collaboration.   Gerhard Munch, MD 04/23/11 [phone]

## 2011-04-30 IMAGING — US US ABDOMEN COMPLETE
1 series · 14 of 25 positions shown · non-contrast
Comparison: CT 07/01/2007

CLINICAL DATA: Abdominal pain

COMPLETE ABDOMINAL ULTRASOUND

[Series 1: us abdomen complete · 0.24mm/px · 14 of 78 slices shown]
[im 1/78]
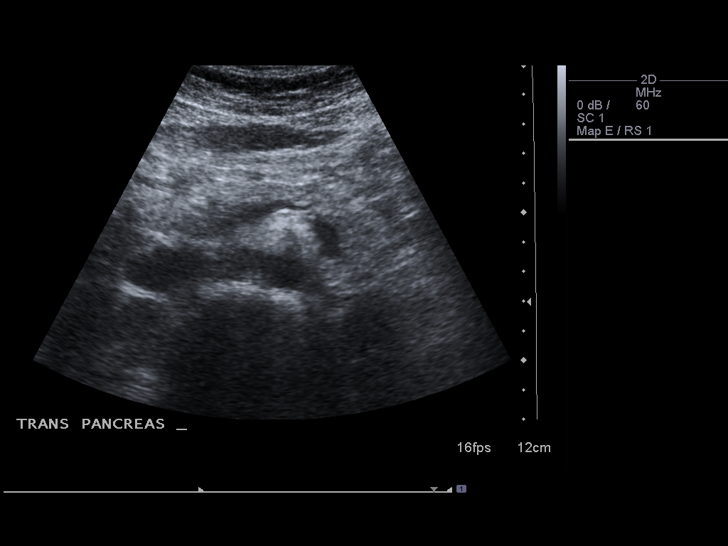
[im 7/78]
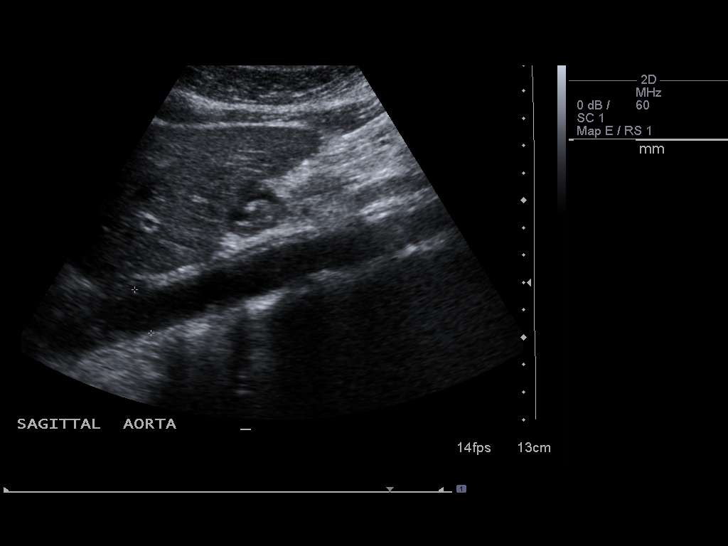
[im 13/78]
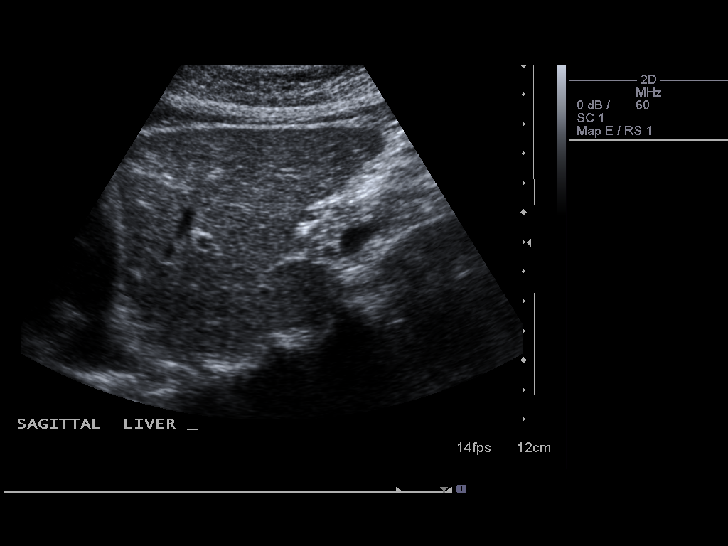
[im 20/78]
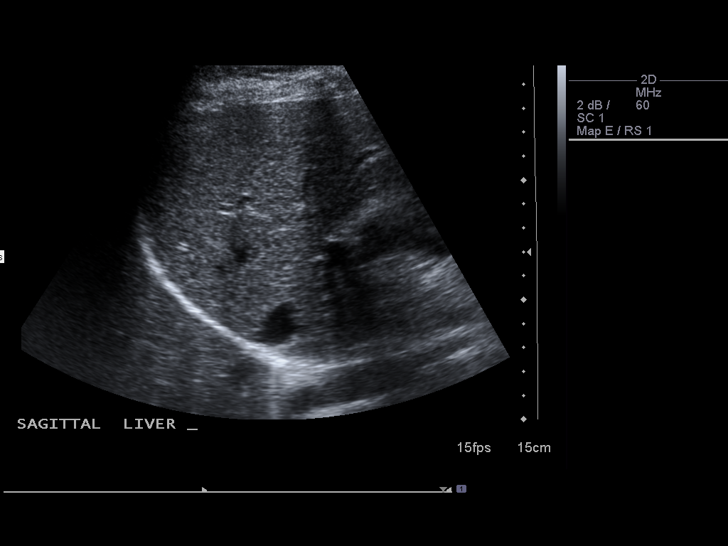
[im 26/78]
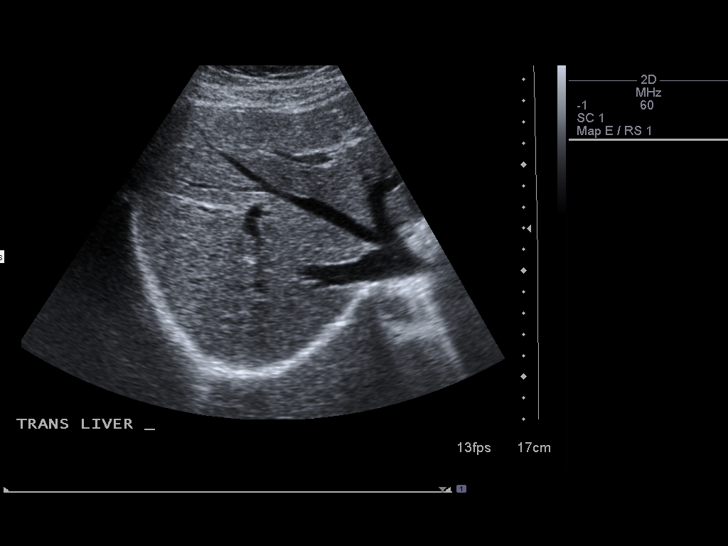
[im 29/78]
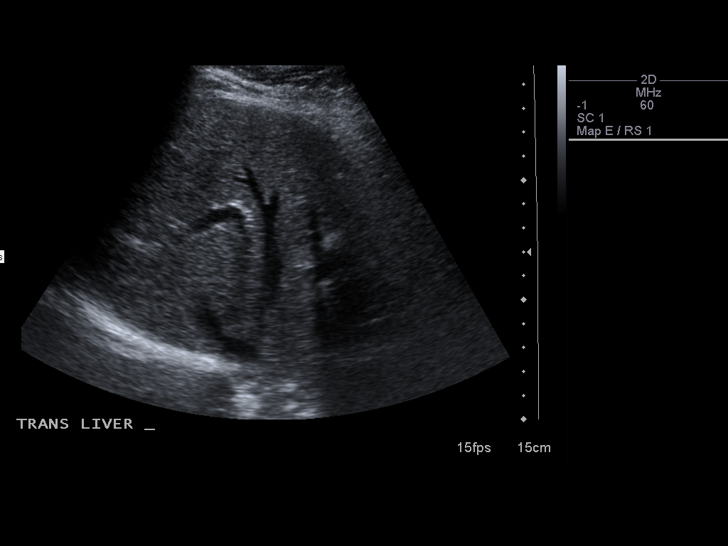
[im 36/78]
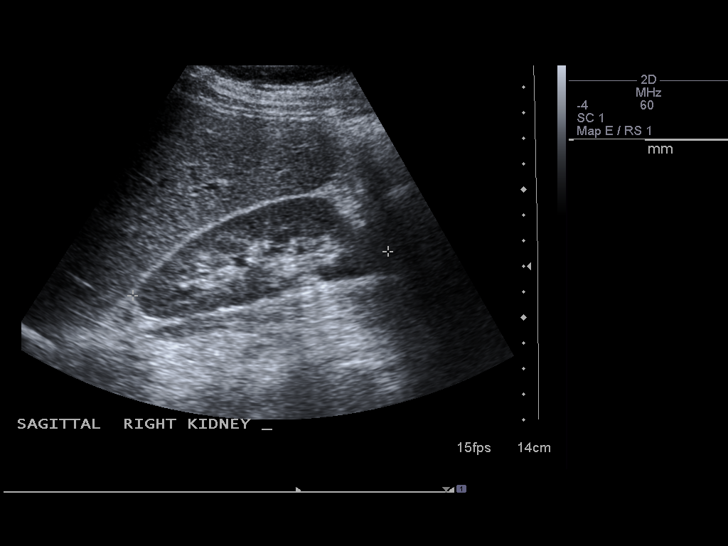
[im 42/78]
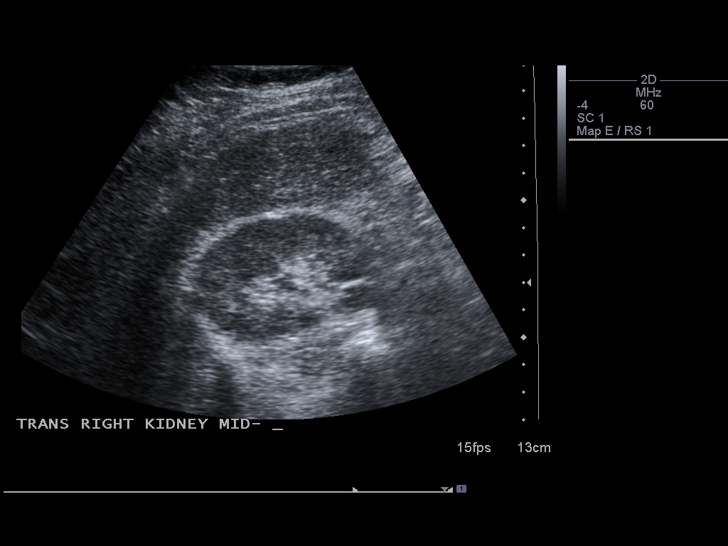
[im 49/78]
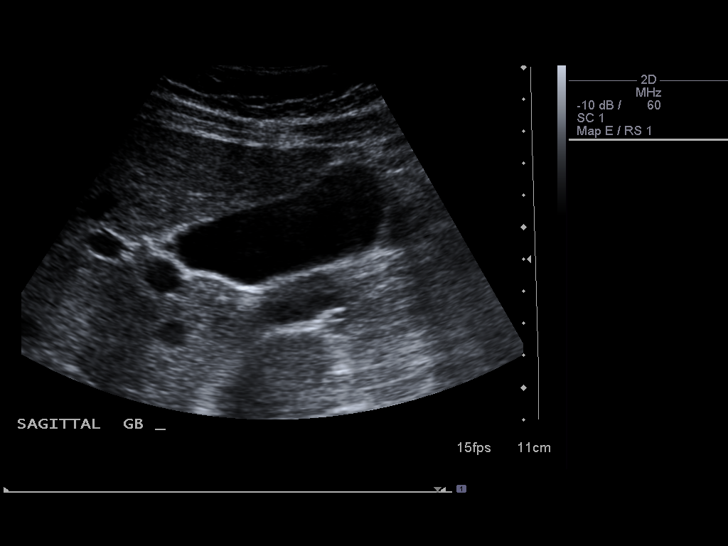
[im 52/78]
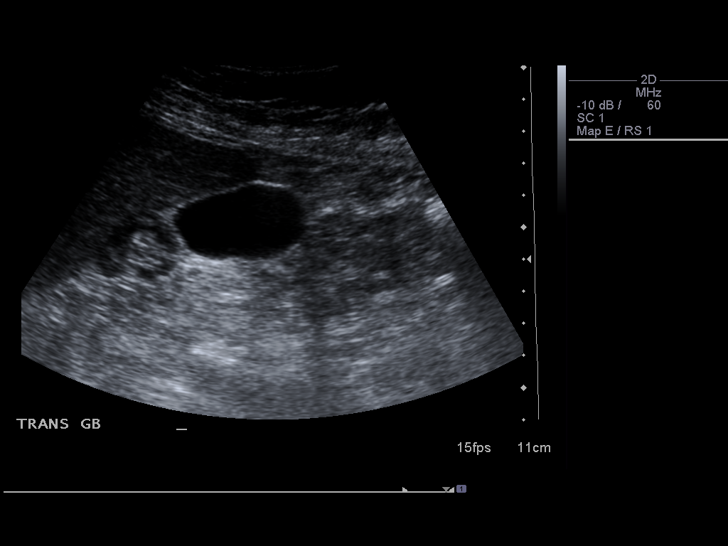
[im 58/78]
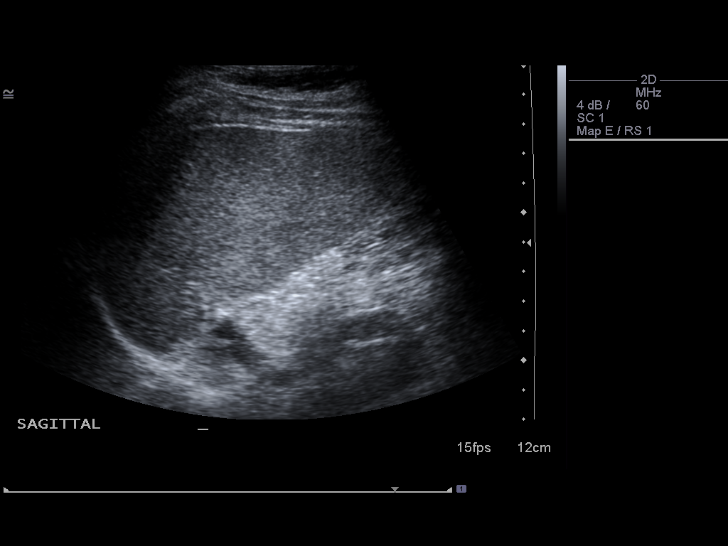
[im 65/78]
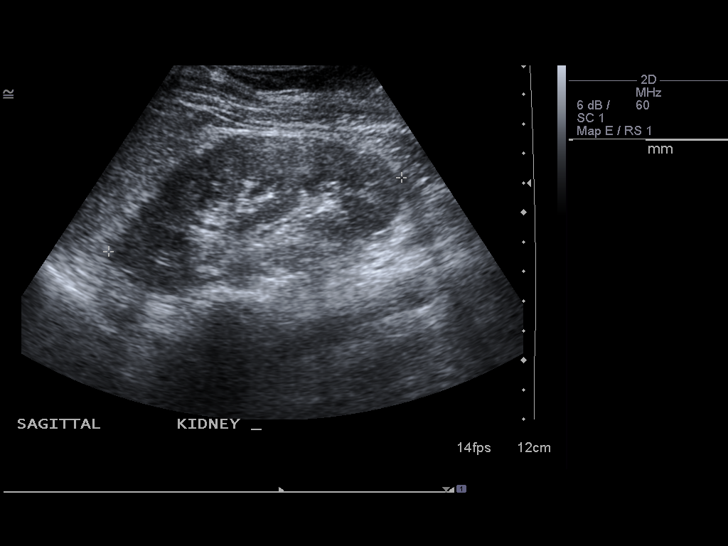
[im 71/78]
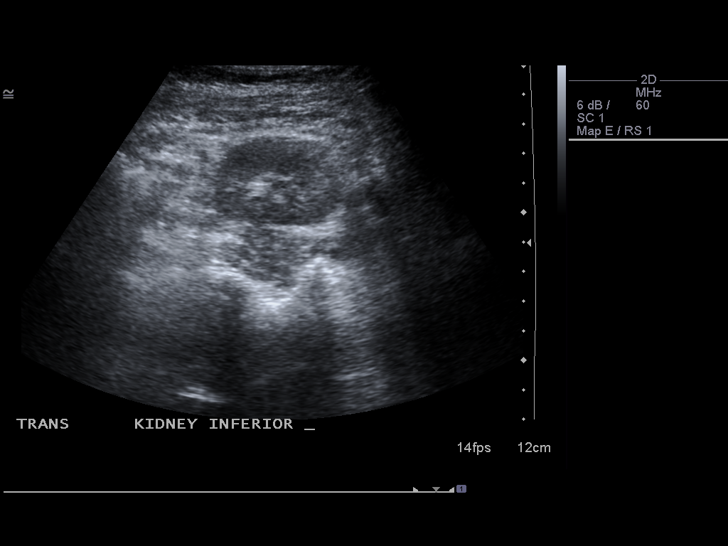
[im 78/78]
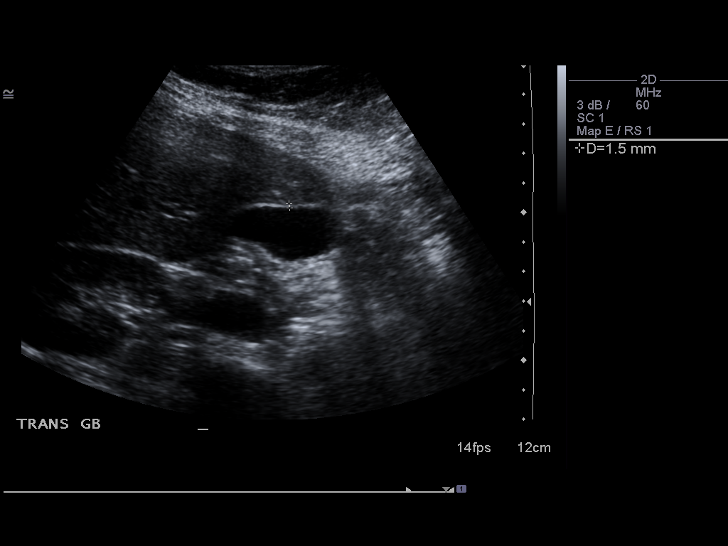

[14 of 25 positions shown; findings below may reference images not displayed]

FINDINGS: Gallbladder:  No gallstones, gallbladder wall thickening, or
pericholecystic fluid.

Common bile duct:  3 mm.  Normal.

Liver:  No focal lesion identified.  Within normal limits in
parenchymal echogenicity.

IVC:  Appears normal.

Pancreas:  No focal abnormality seen.

Spleen:  10.9 cm.  Normal.

Right Kidney:  10.1 cm.  No hydronephrosis or focal mass.

Left Kidney:  10.2 cm.  No hydronephrosis or focal mass.

Abdominal aorta:  No aneurysm identified.
IMPRESSION: Negative abdominal ultrasound.

## 2011-08-05 IMAGING — NM NM HEPATO W/GB/PHARM/[PERSON_NAME]
2 series · 12 of 12 positions shown · non-contrast
Comparison: Abdominal ultrasound 09/06/2009

CLINICAL DATA: Right upper quadrant abdominal pain with nausea.

NUCLEAR MEDICINE HEPATOBILIARY IMAGING WITH GALLBLADDER EF
TECHNIQUE: Sequential images of the abdomen were obtained [DATE]
minutes following intravenous administration of
radiopharmaceutical.  After the slow intravenous infusion of
uCg Cholecystokinin, the gallbladder ejection fraction was
determined.
Radiopharmaceutical:  5.3 mCi Bc-TTm Choletec

[Series 1: he hepato · 4.71mm/px · 6 of 30 frames shown (1 of 2)]
[frame 3/30]
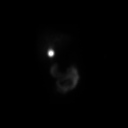
[frame 8/30]
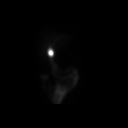
[frame 13/30]
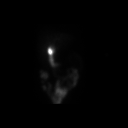
[frame 18/30]
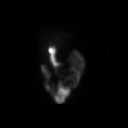
[frame 23/30]
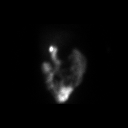
[frame 28/30]
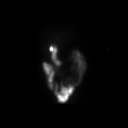

[Series 1: he hepato · 4.71mm/px · 6 of 60 frames shown (2 of 2)]
[frame 6/60]
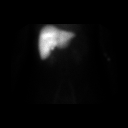
[frame 16/60]
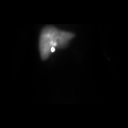
[frame 26/60]
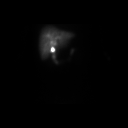
[frame 36/60]
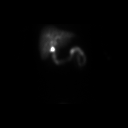
[frame 46/60]
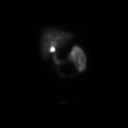
[frame 56/60]
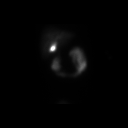

[12 of 12 positions shown; findings below may reference images not displayed]

FINDINGS: Initial images demonstrate homogenous hepatic activity
and prompt visualization of the gallbladder and biliary system.

The stimulated portion of the study demonstrates adequate
gallbladder contraction and progressive small bowel activity.  The
gallbladder ejection fraction calculated at approximately 30
minutes is 71%.  Normal ejection fractions are considered greater
than 30%.

The patient did experience symptoms (abdominal pain) during CCK
administration.
IMPRESSION: 1.  The cystic and common bile ducts are patent.
2.  Normal gallbladder ejection fraction of 71%.  The patient did
experience abdominal pain during CCK administration.

## 2012-12-15 ENCOUNTER — Encounter: Payer: Managed Care, Other (non HMO) | Admitting: Cardiovascular Disease

## 2012-12-15 NOTE — Progress Notes (Signed)
No show for appt. cdm  

## 2015-09-26 DIAGNOSIS — J322 Chronic ethmoidal sinusitis: Secondary | ICD-10-CM | POA: Diagnosis not present

## 2015-10-11 DIAGNOSIS — Z6824 Body mass index (BMI) 24.0-24.9, adult: Secondary | ICD-10-CM | POA: Diagnosis not present

## 2015-10-11 DIAGNOSIS — F901 Attention-deficit hyperactivity disorder, predominantly hyperactive type: Secondary | ICD-10-CM | POA: Diagnosis not present

## 2015-10-15 DIAGNOSIS — R238 Other skin changes: Secondary | ICD-10-CM | POA: Diagnosis not present

## 2015-12-03 DIAGNOSIS — K1120 Sialoadenitis, unspecified: Secondary | ICD-10-CM | POA: Diagnosis not present

## 2015-12-03 DIAGNOSIS — Z6825 Body mass index (BMI) 25.0-25.9, adult: Secondary | ICD-10-CM | POA: Diagnosis not present

## 2015-12-19 DIAGNOSIS — K1120 Sialoadenitis, unspecified: Secondary | ICD-10-CM | POA: Diagnosis not present

## 2015-12-19 DIAGNOSIS — J309 Allergic rhinitis, unspecified: Secondary | ICD-10-CM | POA: Diagnosis not present

## 2015-12-31 DIAGNOSIS — J309 Allergic rhinitis, unspecified: Secondary | ICD-10-CM | POA: Diagnosis not present

## 2016-01-14 DIAGNOSIS — Z6825 Body mass index (BMI) 25.0-25.9, adult: Secondary | ICD-10-CM | POA: Diagnosis not present

## 2016-01-14 DIAGNOSIS — J329 Chronic sinusitis, unspecified: Secondary | ICD-10-CM | POA: Diagnosis not present

## 2016-01-14 DIAGNOSIS — Z1389 Encounter for screening for other disorder: Secondary | ICD-10-CM | POA: Diagnosis not present

## 2016-01-14 DIAGNOSIS — E663 Overweight: Secondary | ICD-10-CM | POA: Diagnosis not present

## 2016-02-25 DIAGNOSIS — Z0000 Encounter for general adult medical examination without abnormal findings: Secondary | ICD-10-CM | POA: Diagnosis not present

## 2016-02-25 DIAGNOSIS — Z23 Encounter for immunization: Secondary | ICD-10-CM | POA: Diagnosis not present

## 2016-02-25 DIAGNOSIS — Z6824 Body mass index (BMI) 24.0-24.9, adult: Secondary | ICD-10-CM | POA: Diagnosis not present

## 2016-02-25 DIAGNOSIS — F901 Attention-deficit hyperactivity disorder, predominantly hyperactive type: Secondary | ICD-10-CM | POA: Diagnosis not present

## 2016-02-27 DIAGNOSIS — R5381 Other malaise: Secondary | ICD-10-CM | POA: Diagnosis not present

## 2016-03-04 DIAGNOSIS — R5381 Other malaise: Secondary | ICD-10-CM | POA: Diagnosis not present

## 2016-05-19 DIAGNOSIS — F901 Attention-deficit hyperactivity disorder, predominantly hyperactive type: Secondary | ICD-10-CM | POA: Diagnosis not present

## 2016-05-19 DIAGNOSIS — E663 Overweight: Secondary | ICD-10-CM | POA: Diagnosis not present

## 2016-05-19 DIAGNOSIS — Z6826 Body mass index (BMI) 26.0-26.9, adult: Secondary | ICD-10-CM | POA: Diagnosis not present

## 2016-08-03 DIAGNOSIS — F901 Attention-deficit hyperactivity disorder, predominantly hyperactive type: Secondary | ICD-10-CM | POA: Diagnosis not present

## 2016-08-03 DIAGNOSIS — Z6825 Body mass index (BMI) 25.0-25.9, adult: Secondary | ICD-10-CM | POA: Diagnosis not present

## 2016-08-03 DIAGNOSIS — E663 Overweight: Secondary | ICD-10-CM | POA: Diagnosis not present

## 2016-09-15 DIAGNOSIS — F901 Attention-deficit hyperactivity disorder, predominantly hyperactive type: Secondary | ICD-10-CM | POA: Diagnosis not present

## 2016-09-15 DIAGNOSIS — D649 Anemia, unspecified: Secondary | ICD-10-CM | POA: Diagnosis not present

## 2016-10-06 DIAGNOSIS — E559 Vitamin D deficiency, unspecified: Secondary | ICD-10-CM | POA: Diagnosis not present

## 2016-10-06 DIAGNOSIS — F988 Other specified behavioral and emotional disorders with onset usually occurring in childhood and adolescence: Secondary | ICD-10-CM | POA: Diagnosis not present

## 2016-10-06 DIAGNOSIS — E663 Overweight: Secondary | ICD-10-CM | POA: Diagnosis not present

## 2016-10-06 DIAGNOSIS — Z6826 Body mass index (BMI) 26.0-26.9, adult: Secondary | ICD-10-CM | POA: Diagnosis not present

## 2016-10-26 DIAGNOSIS — R5381 Other malaise: Secondary | ICD-10-CM | POA: Diagnosis not present

## 2016-10-28 DIAGNOSIS — R5381 Other malaise: Secondary | ICD-10-CM | POA: Diagnosis not present

## 2017-01-07 DIAGNOSIS — F901 Attention-deficit hyperactivity disorder, predominantly hyperactive type: Secondary | ICD-10-CM | POA: Diagnosis not present

## 2017-01-07 DIAGNOSIS — L609 Nail disorder, unspecified: Secondary | ICD-10-CM | POA: Diagnosis not present

## 2017-01-07 DIAGNOSIS — Z6826 Body mass index (BMI) 26.0-26.9, adult: Secondary | ICD-10-CM | POA: Diagnosis not present

## 2017-01-07 DIAGNOSIS — Z9109 Other allergy status, other than to drugs and biological substances: Secondary | ICD-10-CM | POA: Diagnosis not present

## 2017-02-01 ENCOUNTER — Ambulatory Visit (INDEPENDENT_AMBULATORY_CARE_PROVIDER_SITE_OTHER): Payer: BLUE CROSS/BLUE SHIELD | Admitting: Allergy and Immunology

## 2017-02-01 ENCOUNTER — Encounter: Payer: Self-pay | Admitting: Allergy and Immunology

## 2017-02-01 VITALS — BP 130/78 | HR 100 | Temp 98.6°F | Resp 16 | Ht 61.73 in | Wt 140.6 lb

## 2017-02-01 DIAGNOSIS — H1045 Other chronic allergic conjunctivitis: Secondary | ICD-10-CM | POA: Diagnosis not present

## 2017-02-01 DIAGNOSIS — L505 Cholinergic urticaria: Secondary | ICD-10-CM | POA: Diagnosis not present

## 2017-02-01 DIAGNOSIS — H1013 Acute atopic conjunctivitis, bilateral: Secondary | ICD-10-CM

## 2017-02-01 DIAGNOSIS — M2550 Pain in unspecified joint: Secondary | ICD-10-CM

## 2017-02-01 DIAGNOSIS — K900 Celiac disease: Secondary | ICD-10-CM | POA: Diagnosis not present

## 2017-02-01 DIAGNOSIS — J4520 Mild intermittent asthma, uncomplicated: Secondary | ICD-10-CM | POA: Diagnosis not present

## 2017-02-01 DIAGNOSIS — G4720 Circadian rhythm sleep disorder, unspecified type: Secondary | ICD-10-CM

## 2017-02-01 DIAGNOSIS — R51 Headache: Secondary | ICD-10-CM | POA: Diagnosis not present

## 2017-02-01 DIAGNOSIS — J3089 Other allergic rhinitis: Secondary | ICD-10-CM

## 2017-02-01 DIAGNOSIS — G8929 Other chronic pain: Secondary | ICD-10-CM

## 2017-02-01 DIAGNOSIS — R430 Anosmia: Secondary | ICD-10-CM

## 2017-02-01 MED ORDER — MONTELUKAST SODIUM 10 MG PO TABS: 10.0000 mg | ORAL_TABLET | Freq: Every day | ORAL | 5 refills | Status: AC

## 2017-02-01 MED ORDER — ALBUTEROL SULFATE HFA 108 (90 BASE) MCG/ACT IN AERS: INHALATION_SPRAY | RESPIRATORY_TRACT | 1 refills | Status: AC

## 2017-02-01 NOTE — Progress Notes (Signed)
Dear Dr. Sunnie Nielsen,  Thank you for referring Tanya Powers to the [facility] [place] on 02/01/2017.   Below is a summation of this patient's evaluation and recommendations.  Thank you for your referral. I will keep you informed about this patient's response to treatment.   If you have any questions please do not hesitate to contact me.   Sincerely,  Jessica Priest, MD Allergy / Immunology [facility]   ______________________________________________________________________    NEW PATIENT NOTE  Referring Provider: Alba Cory, MD Primary Provider: Eunice Blase, PA-C Date of office visit: 02/01/2017    Subjective:   Chief Complaint:  Tanya Powers (DOB: [birth year]) is a 31 y.o. female who presents to the clinic on 02/01/2017 with a chief complaint of Dizziness; Cough; and Allergic Rhinitis  .     HPI: Tanya Powers presents to this clinic in evaluation of a multitude of different issues.  First, she has a long history of allergic upper airway symptoms with nasal congestion and sneezing along with itchy red watery eyes that appears to occur on a perennial basis without any obvious provoking factor. She has never been given a nasal steroid. As well, she has a lifelong history of intermittent wheezing and coughing. She can't really perform in cardio exercise. She does not have a history of cold air induced bronchospastic symptoms. She has never been given an inhaler. She was apparently a preemie born at 31 weeks. She did smoke from the age of 67 to the age of 59 and discontinued this hobby in April 2016. She smoked one to 2 packs per day.  Second, she has an issue with headaches. She has 2 types of headaches. She has a headache in the back of her head that happens 3 times per week and she has a frontal headache that occurs daily. Both of these headaches are described as "pressing". She does not  have any associated scotoma or nausea. She takes Advil less than 1 time per week because it really doesn't work regarding relief of pain. There is no trigger giving rise to this issue. However, it should be noted that she has very bad sleep. She has fractured sleep and difficulty maintaining sleep. She feels unrefreshed in the morning but she does not take a nap. She does consume about 16 ounces of Coca-Cola per day.  Third, she has a history with lifelong hives that appear to be precipitated by heat whether that be exercising or taking a hot shower. There is no associated systemic or constitutional symptoms or obvious trigger other than heat.  Fourth, she has bloating and intermittent diarrhea without any reflux symptoms but she sometimes does have intermittent abdominal pain for which she has been evaluated for gallbladder abnormalities and apparently diagnostic evaluation has been negative.  Fifth, this past May her status change significantly. She had severe vertigo associated with nasal congestion and sneezing and "brain fog" along with arm and ankle aches and weakness for which she was treated for sinusitis with 2 different antibiotics. She never really improved but then at the tail end of June she eliminated dairy and egg and gluten. She was better within about 2-3 weeks and did very well for about a month. She added back gluten around mid-August and almost everything has returned. It should be noted that she has developed anosmia since May. Interestingly, when she eating gluten free she thinks that her abdominal issue and bloating  issue was better and her headaches were better and she did not have any hives along with the improvement regarding all of her respiratory and constitutional symptoms.  Sixth, she does have a history of eczema affecting her left periorbital region intermittently of many years duration with obvious trigger.  Tanya Powers has seen an ENT doctor who apparently performed skin  testing which identified "allergies directed against dust mite and developing allergies against her dog". She has never had a CT scan of her sinuses. She has not had any blood work in investigation of her issue.  Past Medical History:  Diagnosis Date  . Anemia   . Anxiety   . Menstrual bleeding problem    dysfunction. heavy menses  . Tobacco abuse     Past Surgical History:  Procedure Laterality Date  . CESAREAN SECTION    . KNEE SURGERY     L 2007  . TYMPANOSTOMY TUBE PLACEMENT      Allergies as of 02/01/2017      Reactions   Buspar [buspirone] Rash      Medication List      amphetamine-dextroamphetamine 30 MG tablet Commonly known as:  ADDERALL 2 (two) times daily.       Review of systems negative except as noted in HPI / PMHx or noted below:  Review of Systems  Constitutional: Negative.   HENT: Negative.   Eyes: Negative.   Respiratory: Negative.   Cardiovascular: Negative.   Gastrointestinal: Negative.   Genitourinary: Negative.   Musculoskeletal: Negative.   Skin: Negative.   Neurological: Negative.   Endo/Heme/Allergies: Negative.   Psychiatric/Behavioral: Negative.     Family History  Problem Relation Age of Onset  . Hypertension Unknown        both parents (who are otherwise alive and well)  . Diabetes Maternal Grandmother   . Heart disease Maternal Grandfather        also had rectal cancer     Social History   Social History  . Marital status: Single    Spouse name: N/A  . Number of children: N/A  . Years of education: N/A   Occupational History  . Not on file.   Social History Main Topics  . Smoking status: Former Smoker    Packs/day: 1.50    Years: 12.00    Quit date: 08/22/2014  . Smokeless tobacco: Never Used     Comment: Smoked 1 to 2 packs per day.  . Alcohol use No  . Drug use: No  . Sexual activity: Not on file   Other Topics Concern  . Not on file   Social History Narrative   Single, 1 child.    Works in  [employer]   Daily caffeine - 2 servings per day   Gets regular exercise.     Environmental and Social history  Lives in a house with a dry environment, 3 dogs located inside the household, carpeting in the bedroom, no plastic on the bed, plastic on the pillow, and no smokers located inside the household. She works as a Control and instrumentation engineer.  Objective:   Vitals:   02/01/17 1355  BP: 130/78  Pulse: 100  Resp: 16  Temp: 98.6 F (37 C)   Height: 5' 1.73" (156.8 cm) Weight: 140 lb 9.6 oz (63.8 kg)  Physical Exam  Constitutional: She is well-developed, well-nourished, and in no distress.  HENT:  Head: Normocephalic. Head is without right periorbital erythema and without left periorbital erythema.  Right Ear: Tympanic membrane, external ear and  ear canal normal.  Left Ear: Tympanic membrane, external ear and ear canal normal.  Nose: Nose normal. No mucosal edema or rhinorrhea.  Mouth/Throat: Oropharynx is clear and moist and mucous membranes are normal. No oropharyngeal exudate.  Eyes: Pupils are equal, round, and reactive to light. Conjunctivae and lids are normal.  Neck: Trachea normal. No tracheal deviation present. No thyromegaly present.  Cardiovascular: Normal rate, regular rhythm, S1 normal, S2 normal and normal heart sounds.   No murmur heard. Pulmonary/Chest: Effort normal. No stridor. No tachypnea. No respiratory distress. She has no wheezes. She has no rales. She exhibits no tenderness.  Abdominal: Soft. She exhibits no distension and no mass. There is no hepatosplenomegaly. There is no tenderness. There is no rebound and no guarding.  Musculoskeletal: She exhibits no edema or tenderness.  Lymphadenopathy:       Head (right side): No tonsillar adenopathy present.       Head (left side): No tonsillar adenopathy present.    She has no cervical adenopathy.    She has no axillary adenopathy.  Neurological: She is alert. Gait normal.  Skin: No rash noted. She is not  diaphoretic. No erythema. No pallor. Nails show no clubbing.  Psychiatric: Mood and affect normal.    Diagnostics: Allergy skin tests were performed. She did not demonstrate any hypersensitivity against a screening panel of foods.  Spirometry was performed and demonstrated an FEV1 of 2.46 @ 83 % of predicted. Following the administration of nebulized albuterol her FEV1 did not change significantly.  Assessment and Plan:    1. Asthma, mild intermittent, well-controlled   2. Other allergic rhinitis   3. Perennial allergic conjunctivitis of both eyes   4. Anosmia   5. Chronic nonintractable headache, unspecified headache type   6. Cholinergic urticaria   7. Sleep stage dysfunction   8. Adult celiac disease   9. Arthralgia, unspecified joint     1. Allergen avoidance measures against house dust mite and gluten.  2. Treat and prevent inflammation:   A. OTC Rhinocort one spray each nostril one time per day.  B. montelukast 10 mg tablet 1 time per day  3. Treat and prevent headaches:   A. slowly, slowly taper off all forms of caffeine  4. If needed:   A. nasal saline  B. OTC antihistamine - Claritin/Zyrtec  C. Proventil HFA 2 puffs every 4-6 hours - can use prior to exercise    D. OTC hydrocortisone 1% applied to eczema twice a day  5. Blood - CBC w/diff, celiac screen with IgA, sed, CRP  6.  Further evaluation? CT scan of sinus? Rheumatologic blood tests? Treatment for sleep disorder? Treatment for headaches?  7. Review results of skin testing performed by ENT  8. Return to clinic in 4 weeks or earlier if problem  Tanya Powers has a history consistent with inflammation of her respiratory tract and probably her gut. It is quite possible that she has celiac disease contributing to some of her symptoms and we will investigate that issue with a celiac screen. She also has a tremendous amount of other constitutional and systemic symptoms and if she still continues to have anosmia or  arthralgia or disrupted sleep or continued headaches in the face of the plan noted above we will have her undergo further evaluation for these issues. We will not have her undergo any further evaluation for these issues for the next 4 weeks until we have an opportunity to see what type of response she receives with this  plan.  Jessica Priest, MD Allergy / Immunology [facility] [place]

## 2017-02-01 NOTE — Patient Instructions (Addendum)
  1. Allergen avoidance measures against house dust mite and gluten.  2. Treat and prevent inflammation:   A. OTC Rhinocort one spray each nostril one time per day.  B. montelukast 10 mg tablet 1 time per day  3. Treat and prevent headaches:   A. slowly, slowly taper off all forms of caffeine  4. If needed:   A. nasal saline  B. OTC antihistamine - Claritin/Zyrtec  C. Proventil HFA 2 puffs every 4-6 hours - can use prior to exercise    D. OTC hydrocortisone 1% applied to eczema twice a day  5. Blood - CBC w/diff, celiac screen with IgA, sed, CRP  6.  Further evaluation? CT scan of sinus? Rheumatologic blood tests? Treatment for sleep disorder? Treatment for headaches?  7. Review results of skin testing performed by ENT  8. Return to clinic in 4 weeks or earlier if problem

## 2017-02-03 LAB — CBC WITH DIFFERENTIAL/PLATELET
BASOS: 0 %
Basophils Absolute: 0 10*3/uL (ref 0.0–0.2)
EOS (ABSOLUTE): 0.1 10*3/uL (ref 0.0–0.4)
EOS: 1 %
HEMATOCRIT: 36.1 % (ref 34.0–46.6)
Hemoglobin: 12 g/dL (ref 11.1–15.9)
IMMATURE GRANULOCYTES: 0 %
Immature Grans (Abs): 0 10*3/uL (ref 0.0–0.1)
LYMPHS ABS: 2.5 10*3/uL (ref 0.7–3.1)
Lymphs: 40 %
MCH: 26.2 pg — ABNORMAL LOW (ref 26.6–33.0)
MCHC: 33.2 g/dL (ref 31.5–35.7)
MCV: 79 fL (ref 79–97)
MONOCYTES: 5 %
Monocytes Absolute: 0.3 10*3/uL (ref 0.1–0.9)
Neutrophils Absolute: 3.3 10*3/uL (ref 1.4–7.0)
Neutrophils: 54 %
PLATELETS: 322 10*3/uL (ref 150–379)
RBC: 4.58 x10E6/uL (ref 3.77–5.28)
RDW: 14 % (ref 12.3–15.4)
WBC: 6.2 10*3/uL (ref 3.4–10.8)

## 2017-02-03 LAB — CELIAC PANEL 10
ANTIGLIADIN ABS, IGA: 6 U (ref 0–19)
Endomysial IgA: NEGATIVE
GLIADIN IGG: 2 U (ref 0–19)
IgA/Immunoglobulin A, Serum: 241 mg/dL (ref 87–352)
Tissue Transglut Ab: <2 U/mL (ref 0–5)

## 2017-02-03 LAB — SEDIMENTATION RATE: Sed Rate: 8 mm/hr (ref 0–32)

## 2017-02-03 LAB — C-REACTIVE PROTEIN: CRP: 0.7 mg/L (ref 0.0–4.9)

## 2017-03-04 ENCOUNTER — Ambulatory Visit: Payer: BLUE CROSS/BLUE SHIELD | Admitting: Allergy and Immunology

## 2017-04-15 DIAGNOSIS — D649 Anemia, unspecified: Secondary | ICD-10-CM | POA: Diagnosis not present

## 2017-04-15 DIAGNOSIS — F901 Attention-deficit hyperactivity disorder, predominantly hyperactive type: Secondary | ICD-10-CM | POA: Diagnosis not present

## 2017-04-15 DIAGNOSIS — Z79899 Other long term (current) drug therapy: Secondary | ICD-10-CM | POA: Diagnosis not present

## 2017-04-15 DIAGNOSIS — E559 Vitamin D deficiency, unspecified: Secondary | ICD-10-CM | POA: Diagnosis not present

## 2017-05-19 DIAGNOSIS — D509 Iron deficiency anemia, unspecified: Secondary | ICD-10-CM | POA: Diagnosis not present

## 2017-05-19 DIAGNOSIS — R252 Cramp and spasm: Secondary | ICD-10-CM | POA: Diagnosis not present

## 2017-05-19 DIAGNOSIS — R5382 Chronic fatigue, unspecified: Secondary | ICD-10-CM | POA: Diagnosis not present

## 2017-05-19 DIAGNOSIS — F988 Other specified behavioral and emotional disorders with onset usually occurring in childhood and adolescence: Secondary | ICD-10-CM | POA: Diagnosis not present

## 2017-05-24 DIAGNOSIS — D509 Iron deficiency anemia, unspecified: Secondary | ICD-10-CM | POA: Diagnosis not present

## 2017-05-31 DIAGNOSIS — D509 Iron deficiency anemia, unspecified: Secondary | ICD-10-CM | POA: Diagnosis not present

## 2017-07-05 DIAGNOSIS — R5382 Chronic fatigue, unspecified: Secondary | ICD-10-CM | POA: Diagnosis not present

## 2017-07-05 DIAGNOSIS — F988 Other specified behavioral and emotional disorders with onset usually occurring in childhood and adolescence: Secondary | ICD-10-CM | POA: Diagnosis not present

## 2017-07-05 DIAGNOSIS — D509 Iron deficiency anemia, unspecified: Secondary | ICD-10-CM | POA: Diagnosis not present

## 2017-07-14 DIAGNOSIS — J019 Acute sinusitis, unspecified: Secondary | ICD-10-CM | POA: Diagnosis not present

## 2017-10-05 DIAGNOSIS — R768 Other specified abnormal immunological findings in serum: Secondary | ICD-10-CM | POA: Diagnosis not present

## 2017-10-05 DIAGNOSIS — D509 Iron deficiency anemia, unspecified: Secondary | ICD-10-CM | POA: Diagnosis not present

## 2017-10-05 DIAGNOSIS — Z79899 Other long term (current) drug therapy: Secondary | ICD-10-CM | POA: Diagnosis not present

## 2017-10-05 DIAGNOSIS — W010XXA Fall on same level from slipping, tripping and stumbling without subsequent striking against object, initial encounter: Secondary | ICD-10-CM | POA: Diagnosis not present

## 2017-10-05 DIAGNOSIS — F988 Other specified behavioral and emotional disorders with onset usually occurring in childhood and adolescence: Secondary | ICD-10-CM | POA: Diagnosis not present

## 2017-10-05 DIAGNOSIS — R5382 Chronic fatigue, unspecified: Secondary | ICD-10-CM | POA: Diagnosis not present

## 2017-10-06 DIAGNOSIS — Z043 Encounter for examination and observation following other accident: Secondary | ICD-10-CM | POA: Diagnosis not present

## 2017-10-06 DIAGNOSIS — W010XXA Fall on same level from slipping, tripping and stumbling without subsequent striking against object, initial encounter: Secondary | ICD-10-CM | POA: Diagnosis not present

## 2017-10-06 DIAGNOSIS — S022XXA Fracture of nasal bones, initial encounter for closed fracture: Secondary | ICD-10-CM | POA: Diagnosis not present

## 2017-10-06 DIAGNOSIS — S0121XA Laceration without foreign body of nose, initial encounter: Secondary | ICD-10-CM | POA: Diagnosis not present

## 2017-10-27 DIAGNOSIS — R768 Other specified abnormal immunological findings in serum: Secondary | ICD-10-CM | POA: Diagnosis not present

## 2017-10-27 DIAGNOSIS — D509 Iron deficiency anemia, unspecified: Secondary | ICD-10-CM | POA: Diagnosis not present

## 2017-10-27 DIAGNOSIS — R5383 Other fatigue: Secondary | ICD-10-CM | POA: Diagnosis not present

## 2017-10-27 DIAGNOSIS — R202 Paresthesia of skin: Secondary | ICD-10-CM | POA: Diagnosis not present

## 2017-11-08 DIAGNOSIS — H8111 Benign paroxysmal vertigo, right ear: Secondary | ICD-10-CM | POA: Diagnosis not present

## 2017-11-08 DIAGNOSIS — W1789XD Other fall from one level to another, subsequent encounter: Secondary | ICD-10-CM | POA: Diagnosis not present

## 2017-11-08 DIAGNOSIS — S0121XD Laceration without foreign body of nose, subsequent encounter: Secondary | ICD-10-CM | POA: Diagnosis not present

## 2017-11-26 ENCOUNTER — Ambulatory Visit: Payer: BLUE CROSS/BLUE SHIELD | Admitting: Allergy

## 2017-12-02 ENCOUNTER — Ambulatory Visit: Payer: BLUE CROSS/BLUE SHIELD | Admitting: Allergy

## 2017-12-02 ENCOUNTER — Encounter: Payer: Self-pay | Admitting: Allergy

## 2017-12-02 VITALS — BP 150/85 | HR 85 | Temp 98.2°F | Resp 16 | Ht 62.0 in | Wt 152.0 lb

## 2017-12-02 DIAGNOSIS — J3089 Other allergic rhinitis: Secondary | ICD-10-CM | POA: Diagnosis not present

## 2017-12-02 DIAGNOSIS — T781XXD Other adverse food reactions, not elsewhere classified, subsequent encounter: Secondary | ICD-10-CM

## 2017-12-02 DIAGNOSIS — J329 Chronic sinusitis, unspecified: Secondary | ICD-10-CM | POA: Diagnosis not present

## 2017-12-02 MED ORDER — EPINEPHRINE 0.3 MG/0.3ML IJ SOAJ: INTRAMUSCULAR | 2 refills | Status: AC

## 2017-12-02 NOTE — Progress Notes (Signed)
Follow-up Note  RE: Tanya Powers MRN: [id number] DOB: 29-Jun-1985 Date of Office Visit: 12/02/2017   History of present illness: Tanya Powers is a 32 y.o. female presenting today for new complaint of chronic fatigue.  She believes fatigue could be related to allergies.   She was last seen in the office on February 01, 2017 by Dr. Lucie Leather at which time she was evaluated for asthma, allergic rhinoconjunctivitis, anosmia, headache, urticaria, sleep dysfunction, arthralgia .  Since this visit she denies any major health changes, surgeries or hospitalizations.  She states she is no better than when she was at last visit.  She still reports chronic fatigue, muscle weakness, hives after eating.  She also reports having felt a throat tightness feeling after eating and states once last year she had a reaction where she did receive an injection which she states was epinephrine.  She does not have an epinephrine device.   She states she can not pinpoint any particular foods that triggers these symptoms but is most concerned about additive and preservatives and dyes.   She also states she has a lot of nasal drainage and post-nasal drip.  She is not taking any allergy medications at this time.  She did have allergy skin prick testing done by an ENT in 2016 that revealed dust mites and dog.  She does report having frequent sinus infections and states last year when she was eating gluten she was having back to back infections.  Once she cut gluten out the diet she states the frequency of sinus infections slowed down.  She denies any history of recurrent PNA, AOM or skin infections and has not required hospitalization with need for IV antibiotics.   She does not feel that she has asthma.  She states she was having cough and wheezing when she was ingestion gluten and this too resolved with elimination of gluten from the diet.  She does not have any inhalers.  . She does have an appt with rheumatology next week as  she does have an mildly elevated ANA.   She state she has had several thyroid studies done that have been normal (I do not have access to these results).  She did have testing in May 2019 including CBC (without diff) that was normal with normal iron studies and normal BMP.    Review of systems: Review of Systems  Constitutional: Positive for malaise/fatigue. Negative for chills, fever and weight loss.  HENT: Positive for congestion. Negative for ear discharge, ear pain, nosebleeds and sore throat.   Eyes: Negative for pain, discharge and redness.  Respiratory: Negative for cough, shortness of breath and wheezing.   Cardiovascular: Negative for chest pain.  Gastrointestinal: Negative for heartburn, nausea and vomiting.  Musculoskeletal: Positive for myalgias.  Skin: Negative for itching and rash.  Neurological: Negative for headaches.    All other systems negative unless noted above in HPI  Past medical/social/surgical/family history have been reviewed and are unchanged unless specifically indicated below.  No changes  Medication List: Allergies as of 12/02/2017      Reactions   Buspar [buspirone] Rash      Medication List        Accurate as of 12/02/17  4:58 PM. Always use your most recent med list.        amphetamine-dextroamphetamine 30 MG tablet Commonly known as:  ADDERALL 2 (two) times daily.     Known medication allergies: Allergies  Allergen Reactions  . Buspar [Buspirone] Rash  Physical examination: Blood pressure (!) 150/85, pulse 85, temperature 98.2 F (36.8 C), resp. rate 16, height 5\' 2"  (1.575 m), weight 152 lb (68.9 kg), SpO2 96 %.  General: Alert, interactive, in no acute distress. HEENT: PERRLA, TMs pearly gray, turbinates non-edematous without discharge, post-pharynx non erythematous. Neck: Supple without lymphadenopathy. Lungs: Clear to auscultation without wheezing, rhonchi or rales. {no increased work of breathing. CV: Normal S1, S2  without murmurs. Abdomen: Nondistended, nontender. Skin: Warm and dry, without lesions or rashes. Extremities:  No clubbing, cyanosis or edema. Neuro:   Grossly intact.  Diagnositics/Labs: Labs:  Component     Latest Ref Rng & Units 02/01/2017  WBC     3.4 - 10.8 x10E3/uL 6.2  RBC     3.77 - 5.28 x10E6/uL 4.58  Hemoglobin     11.1 - 15.9 g/dL 11.912.0  HCT     14.734.0 - 82.946.6 % 36.1  MCV     79 - 97 fL 79  MCH     26.6 - 33.0 pg 26.2 (L)  MCHC     31.5 - 35.7 g/dL 56.233.2  RDW     13.012.3 - 86.515.4 % 14.0  Platelets     150 - 379 x10E3/uL 322  Neutrophils     Not Estab. % 54  Lymphs     Not Estab. % 40  Monocytes     Not Estab. % 5  Eos     Not Estab. % 1  Basos     Not Estab. % 0  NEUT#     1.4 - 7.0 x10E3/uL 3.3  Lymphocyte #     0.7 - 3.1 x10E3/uL 2.5  Monocytes Absolute     0.1 - 0.9 x10E3/uL 0.3  EOS (ABSOLUTE)     0.0 - 0.4 x10E3/uL 0.1  Basophils Absolute     0.0 - 0.2 x10E3/uL 0.0  Immature Granulocytes     Not Estab. % 0  Immature Grans (Abs)     0.0 - 0.1 x10E3/uL 0.0  Antigliadin Abs, IgA     0 - 19 units 6  Deamidated Gliadin Abs, IgG     0 - 19 units 2  Transglutaminase IgA     0 - 3 U/mL <2  Tissue Transglut Ab     0 - 5 U/mL <2  Endomysial IgA     Negative Negative  IgA/Immunoglobulin A, Serum     87 - 352 mg/dL [id number]  Sed Rate     0 - 32 mm/hr 8  CRP     0.0 - 4.9 mg/L 0.7   Assessment and plan:   Adverse food reaction - she reports fatigue, muscle weakness, hives and occ throat tightness sensation after eating.  Fatigue and weakness are not specific symptoms typical of allergic reaction.  She also can not pinpoint any particular foods that trigger these symptoms.  It is likely that she does not have true IgE mediated food allergy and may have some food intolerance.  She had skin testing to food panel at last visit that was completely negative.  Discussed today there are no testing modalities available for additives/perservatives.  Advised if  concerned about these she could do elimination trial of foods with additives/preservatives to determine if symptoms do not occur.  She is concerned about colorants which we do have serum IgE levels to red dye (carmine) and yellow dye (annatto) which will check.  She does eat red meat in the diet and will check alpha gal panel.  She would also like to have serum IgE testing done to common foods.  I discussed if IgE levels are negative then if she has double negative testing IgE mediated food allergy is ruled out.   Will set up her with an epinephrine device.   Allergic rhinitis - will have her try nasal atrovent for prn use for control of nasal drainage Recurrent sinus infections - will obtain immunocompetence work-up History of cough and wheeze - per pt resolved with removal of gluten from diet  1. Allergen avoidance measures against house dust mite and dog and gluten  2. For nasal drainage/post-nasal drip:   A. Nasal atrovent 2 sprays each nostril up to 3-4 times a day as needed  3. Blood - will obtain serum IgE levels to common food allergens (milk, wheat, soy, peanut, egg, fish/shellfish), red and yellow dye, alpha gal panel.   Will also obtain immunocompetence work-up (CBC with diff, immunoglobulins, vaccine titers)  4.  Will prescribe epinephrine device Audry Riles) in case of severe life-threatening reactions.  Follow emergency action plan.    5. Return to clinic in 6 months or earlier if problem  I appreciate the opportunity to take part in [facility] care. Please do not hesitate to contact me with questions.  Sincerely,   Margo Aye, MD Allergy/Immunology [facility]

## 2017-12-02 NOTE — Patient Instructions (Addendum)
  1. Allergen avoidance measures against house dust mite and gluten.  2. For nasal drainage/post-nasal drip:   A. Nasal atrovent 2 sprays each nostril up to 3-4 times a day as needed  3. Blood - will obtain serum IgE levels to common food allergens (milk, wheat, soy, peanut, egg, fish/shellfish), red and yellow dye, alpha gal panel.   Will also obtain immunocompetence work-up (CBC with diff, immunoglobulins, vaccine titers)  4.  Will prescribe epinephrine device Tanya Powers(AuviQ) in case of severe life-threatening reactions.  Follow emergency action plan.    5. Return to clinic in 6 months or earlier if problem

## 2017-12-06 NOTE — Progress Notes (Signed)
Office Visit Note  Patient: Tanya Powers             Date of Birth: June 02, 1985           MRN: [id number]             PCP: Lind Guest, NP Referring: Lind Guest, NP Visit Date: 12/16/2017 Occupation: Health coach  Subjective:  New Patient (Initial Visit) (Abnormal labs, elevated ANA, severe fatigue x 2 years)   History of Present Illness: Tanya Powers is a 32 y.o. female seen in consultation per request of her PCP.  According to patient she has had fatigue all of her life.  She also gives a history of chronic insomnia.  She has been on Adderall for several years.  She recalls in April 2017 she started experiencing increased fatigue and felt that Adderall was not working.  She had an episode when she fell while running due to weakness in her left lower extremity.  She went to see her PCP and had extensive work-up and was given iron infusion which did not help.  She continued to see experience muscle weakness and there were episodes when she could not get out of bed.  She feels some trembling sensation in her lower extremities when she walks.  She has been also experiencing tingling sensation which moves around on her body.  She has been having brain fog.  She states she has gained 35 pounds over the last 1 year.  She feels bloated and is unable to lose weight.  She has been also diagnosed with a strep throat 3 times in the last 2 years.  She had recent labs done by her PCP which showed positive ANA for that reason she was referred to me.  Activities of Daily Living:  Patient reports morning stiffness for 2 hours.   Patient Denies nocturnal pain.  Difficulty dressing/grooming: Denies Difficulty climbing stairs: Denies Difficulty getting out of chair: Denies Difficulty using hands for taps, buttons, cutlery, and/or writing: Denies  Review of Systems  Constitutional: Positive for fatigue. Negative for activity change, night sweats, weight gain and weight loss.  HENT: Positive for  mouth dryness. Negative for mouth sores, trouble swallowing, trouble swallowing and nose dryness.   Eyes: Negative for pain, redness, visual disturbance and dryness.  Respiratory: Negative for cough, shortness of breath and difficulty breathing.   Cardiovascular: Negative for chest pain, palpitations, hypertension, irregular heartbeat and swelling in legs/feet.  Gastrointestinal: Positive for constipation, diarrhea, heartburn and nausea. Negative for blood in stool.  Endocrine: Negative for cold intolerance, heat intolerance, excessive thirst and increased urination.  Genitourinary: Negative for decreased urine output and vaginal dryness.  Musculoskeletal: Positive for arthralgias, gait problem, joint pain, myalgias, morning stiffness and myalgias. Negative for joint swelling, muscle weakness and muscle tenderness.  Skin: Positive for hair loss. Negative for color change, rash, skin tightness, ulcers and sensitivity to sunlight.  Allergic/Immunologic: Negative for susceptible to infections.  Neurological: Positive for dizziness, numbness and headaches. Negative for light-headedness, memory loss, night sweats and weakness.  Hematological: Negative for bruising/bleeding tendency and swollen glands.  Psychiatric/Behavioral: Positive for sleep disturbance. Negative for depressed mood. The patient is not nervous/anxious.     PMFS History:  Patient Active Problem List   Diagnosis Date Noted  . ABDOMINAL PAIN, UNSPECIFIED SITE 11/26/2009  . MITRAL REGURGITATION 10/15/2009  . TOBACCO ABUSE 07/02/2006  . COUGH 07/02/2006  . Nonspecific (abnormal) findings on radiological and other examination of body structure 07/02/2006  . ABFND,  RADIOLOGICAL, LUNG FIELD 07/02/2006    Past Medical History:  Diagnosis Date  . Anemia   . Anxiety   . Menstrual bleeding problem    dysfunction. heavy menses  . Tobacco abuse   . Urticaria     Family History  Problem Relation Age of Onset  . Hypertension Unknown         both parents (who are otherwise alive and well)  . Diabetes Maternal Grandmother   . Heart disease Maternal Grandfather        also had rectal cancer    Past Surgical History:  Procedure Laterality Date  . CESAREAN SECTION    . KNEE SURGERY     L 2007  . TYMPANOSTOMY TUBE PLACEMENT     Social History   Social History Narrative   Single, 1 child.    Works in [employer]   Daily caffeine - 2 servings per day   Gets regular exercise.     Objective: Vital Signs: BP 107/61 (BP Location: Right Arm, Patient Position: Sitting, Cuff Size: Normal)   Pulse 75   Resp 14   Ht '5\' 2"'$  (1.575 m)   Wt 160 lb 6.4 oz (72.8 kg)   LMP 12/10/2017   BMI 29.34 kg/m    Physical Exam  Constitutional: She is oriented to person, place, and time. She appears well-developed and well-nourished.  HENT:  Head: Normocephalic and atraumatic.  Eyes: Conjunctivae and EOM are normal.  Neck: Normal range of motion.  Cardiovascular: Normal rate, regular rhythm, normal heart sounds and intact distal pulses.  Pulmonary/Chest: Effort normal and breath sounds normal.  Abdominal: Soft. Bowel sounds are normal.  Lymphadenopathy:    She has no cervical adenopathy.  Neurological: She is alert and oriented to person, place, and time.  Skin: Skin is warm and dry. Capillary refill takes less than 2 seconds.  Psychiatric: She has a normal mood and affect. Her behavior is normal.  Nursing note and vitals reviewed.    Musculoskeletal Exam: C-spine thoracic lumbar spine good range of motion.  She had no SI joint tenderness.  Shoulder joints elbow joints wrist joint MCPs PIPs DIPs were in good range of motion.  She has some hypermobility in her elbows wrist joints MCPs and PIPs.  Hip joints knee joints ankles MTPs PIPs were in good range of motion.  No synovitis was noted.  CDAI Exam: No CDAI exam completed.   Investigation: Findings:  10/27/17: Vitamin B12 344, folate 8.0, cortisol 6.3, ACTH 16.1, iron  TIBC 351, iron 58, iron saturation 17%, TSH 1.610, CBC WNL, CMP WNL   Imaging: Xr Foot 2 Views Left  Result Date: 12/16/2017 No MTP PIP DIP narrowing was noted.  No intertarsal joint space narrowing was noted.  No erosive changes were noted. Impression: Unremarkable x-ray of the foot.  Xr Foot 2 Views Right  Result Date: 12/16/2017 No MTP PIP DIP narrowing was noted.  No intertarsal joint space narrowing was noted.  No erosive changes were noted. Impression: Unremarkable x-ray of the foot.  Xr Hand 2 View Left  Result Date: 12/16/2017 No MCP PIP DIP narrowing was noted.  No intercarpal radiocarpal joint space narrowing was noted.  No erosive changes were noted. Impression: Unremarkable x-ray of the hand.  Xr Hand 2 View Right  Result Date: 12/16/2017 No MCP PIP DIP narrowing was noted.  No intercarpal radiocarpal joint space narrowing was noted.  No erosive changes were noted. Impression: Unremarkable x-ray of the hand.   Recent Labs: Lab  Results  Component Value Date   WBC 6.7 12/02/2017   HGB 14.1 12/02/2017   PLT 295 12/02/2017   NA 146 (H) 09/07/2009   K 3.5 09/07/2009   CL 104 09/07/2009   CO2 31 09/07/2009   GLUCOSE 82 09/07/2009   BUN 10 09/07/2009   CREATININE 0.6 09/07/2009   BILITOT 0.6 12/12/2009   ALKPHOS 71 12/12/2009   AST 18 12/12/2009   ALT 16 12/12/2009   PROT 7.0 12/12/2009   ALBUMIN 4.0 12/12/2009   CALCIUM 9.0 09/07/2009   GFRAA  09/07/2009    >60        The eGFR has been calculated using the MDRD equation. This calculation has not been validated in all clinical situations. eGFR's persistently <60 mL/min signify possible Chronic Kidney Disease.    Speciality Comments: No specialty comments available.  Procedures:  No procedures performed Allergies: Buspar [buspirone]   Assessment / Plan:     Visit Diagnoses: Positive ANA (antinuclear antibody) - 07/05/17: dsDNA 2, RNP <0.2, SS-A 1.1, SS-B <0.2, smith <0.2.  Patient gives history of  intermittent hives and sicca symptoms.  She also gives history of fatigue.  I will obtainAVISE labs.  Myalgia-she complains of increased myalgias.  I will obtain CK and vitamin D level today.  Other fatigue -she complains of fatigue all her life.  She has been taking Adderall for ADHD which helps her to some extent.  Plan: CK, VITAMIN D 25 Hydroxy (Vit-D Deficiency, Fractures), Serum protein electrophoresis with reflex  Chronic insomnia-good sleep hygiene was discussed.  This could be contributing to her fatigue.  Pain in both hands -she has some hypermobility in her hands but no synovitis was noted.  Plan: XR Hand 2 View Right, XR Hand 2 View Left, x-ray of bilateral hands were unremarkable., Sedimentation rate, Rheumatoid factor, Cyclic citrul peptide antibody, IgG  Pain in both feet -she complains of pain in her feet with intermittent swelling.  No synovitis was noted.  Plan: XR Foot 2 Views Right, XR Foot 2 Views Left.  The x-ray of bilateral feet were unremarkable.  Myofascial pain-she experiences generalized pain arthralgias myalgias neuralgias which raises the concern of myofascial pain syndrome or fibromyalgia.  She has been taking naltrexone.  She may benefit from Cymbalta.  Need for regular exercise and water aerobics was discussed.  History of iron deficiency anemia-patient reports that she has had iron infusions.  ADD (attention deficit disorder) without hyperactivity-she has been on Adderall for a long time.  Former smoker - Quit in 2016, smoked 1/2 ppd x12 years  Mitral valve insufficiency, unspecified etiology   Orders: Orders Placed This Encounter  Procedures  . XR Hand 2 View Right  . XR Hand 2 View Left  . XR Foot 2 Views Right  . XR Foot 2 Views Left  . CK  . VITAMIN D 25 Hydroxy (Vit-D Deficiency, Fractures)  . Sedimentation rate  . Rheumatoid factor  . Cyclic citrul peptide antibody, IgG  . Serum protein electrophoresis with reflex   No orders of the defined  types were placed in this encounter.   Face-to-face time spent with patient was 50 minutes. Greater than 50% of time was spent in counseling and coordination of care.  Follow-Up Instructions: Return for Arthralgias, myalgias, positive ANA.   Bo Merino, MD  Note - This record has been created using Editor, commissioning.  Chart creation errors have been sought, but may not always  have been located. Such creation errors do not reflect on  the standard of medical care. 

## 2017-12-07 LAB — CBC WITH DIFFERENTIAL/PLATELET
BASOS: 0 %
Basophils Absolute: 0 10*3/uL (ref 0.0–0.2)
EOS (ABSOLUTE): 0.1 10*3/uL (ref 0.0–0.4)
EOS: 2 %
HEMATOCRIT: 41.8 % (ref 34.0–46.6)
HEMOGLOBIN: 14.1 g/dL (ref 11.1–15.9)
Immature Grans (Abs): 0 10*3/uL (ref 0.0–0.1)
Immature Granulocytes: 0 %
Lymphocytes Absolute: 2.3 10*3/uL (ref 0.7–3.1)
Lymphs: 34 %
MCH: 29.1 pg (ref 26.6–33.0)
MCHC: 33.7 g/dL (ref 31.5–35.7)
MCV: 86 fL (ref 79–97)
MONOCYTES: 5 %
Monocytes Absolute: 0.3 10*3/uL (ref 0.1–0.9)
NEUTROS ABS: 4 10*3/uL (ref 1.4–7.0)
Neutrophils: 59 %
Platelets: 295 10*3/uL (ref 150–450)
RBC: 4.84 x10E6/uL (ref 3.77–5.28)
RDW: 13.7 % (ref 12.3–15.4)
WBC: 6.7 10*3/uL (ref 3.4–10.8)

## 2017-12-07 LAB — ALPHA-GAL PANEL
Beef (Bos spp) IgE: <0.1 kU/L (ref <0.35)
Class Interpretation: 0
LAMB CLASS INTERPRETATION: 0
PORK CLASS INTERPRETATION: 0

## 2017-12-07 LAB — ALLERGEN, RED (CARMINE) DYE, RF340: F340-IgE Carmine Red Dye: <0.1 kU/L

## 2017-12-07 LAB — ALLERGEN, ANNATTO SEED
Annatto Seed IgE*: <0.35 kU/L (ref <0.35)
Class Interpretation: 0

## 2017-12-07 LAB — IGG, IGA, IGM
IgA/Immunoglobulin A, Serum: 237 mg/dL (ref 87–352)
IgG (Immunoglobin G), Serum: 1030 mg/dL (ref 700–1600)
IgM (Immunoglobulin M), Srm: 120 mg/dL (ref 26–217)

## 2017-12-07 LAB — STREP PNEUMONIAE 23 SEROTYPES IGG
PNEUMO AB TYPE 19 (19F): 3.8 ug/mL (ref >1.3)
PNEUMO AB TYPE 20: 1.1 ug/mL — AB (ref >1.3)
PNEUMO AB TYPE 22 (22F): 0.4 ug/mL — AB (ref >1.3)
PNEUMO AB TYPE 34 (10A): 0.5 ug/mL — AB (ref >1.3)
PNEUMO AB TYPE 3: 1.5 ug/mL (ref >1.3)
PNEUMO AB TYPE 57 (19A): 3.8 ug/mL (ref >1.3)
PNEUMO AB TYPE 68 (9V): 0.5 ug/mL — AB (ref >1.3)
PNEUMO AB TYPE 8: 1.7 ug/mL (ref >1.3)
Pneumo Ab Type 1*: <0.1 ug/mL — ABNORMAL LOW (ref >1.3)
Pneumo Ab Type 12 (12F)*: 0.3 ug/mL — ABNORMAL LOW (ref >1.3)
Pneumo Ab Type 14*: 1.5 ug/mL (ref >1.3)
Pneumo Ab Type 17 (17F)*: 3.4 ug/mL (ref >1.3)
Pneumo Ab Type 2*: 1.1 ug/mL — ABNORMAL LOW (ref >1.3)
Pneumo Ab Type 23 (23F)*: <0.1 ug/mL — ABNORMAL LOW (ref >1.3)
Pneumo Ab Type 26 (6B)*: 0.4 ug/mL — ABNORMAL LOW (ref >1.3)
Pneumo Ab Type 4*: <0.1 ug/mL — ABNORMAL LOW (ref >1.3)
Pneumo Ab Type 43 (11A)*: 1.1 ug/mL — ABNORMAL LOW (ref >1.3)
Pneumo Ab Type 5*: 0.7 ug/mL — ABNORMAL LOW (ref >1.3)
Pneumo Ab Type 51 (7F)*: 1.3 ug/mL — ABNORMAL LOW (ref >1.3)
Pneumo Ab Type 54 (15B)*: 1.3 ug/mL — ABNORMAL LOW (ref >1.3)
Pneumo Ab Type 56 (18C)*: <0.1 ug/mL — ABNORMAL LOW (ref >1.3)
Pneumo Ab Type 70 (33F)*: 1 ug/mL — ABNORMAL LOW (ref >1.3)
Pneumo Ab Type 9 (9N)*: 0.2 ug/mL — ABNORMAL LOW (ref >1.3)

## 2017-12-07 LAB — DIPHTHERIA / TETANUS ANTIBODY PANEL
DIPHTHERIA AB: 0.17 [IU]/mL (ref <0.10)
Tetanus Ab, IgG: 0.17 IU/mL (ref <0.10)

## 2017-12-07 LAB — IGE EGG WHITE W/COMPONENT RFLX: Egg White IgE: <0.1 kU/L

## 2017-12-07 LAB — ALLERGEN, CASEIN, F78

## 2017-12-07 LAB — ALLERGEN CASHEW

## 2017-12-16 ENCOUNTER — Ambulatory Visit (INDEPENDENT_AMBULATORY_CARE_PROVIDER_SITE_OTHER): Payer: Self-pay

## 2017-12-16 ENCOUNTER — Encounter: Payer: Self-pay | Admitting: Rheumatology

## 2017-12-16 ENCOUNTER — Ambulatory Visit: Payer: BLUE CROSS/BLUE SHIELD | Admitting: Rheumatology

## 2017-12-16 VITALS — BP 107/61 | HR 75 | Resp 14 | Ht 62.0 in | Wt 160.4 lb

## 2017-12-16 DIAGNOSIS — Z862 Personal history of diseases of the blood and blood-forming organs and certain disorders involving the immune mechanism: Secondary | ICD-10-CM

## 2017-12-16 DIAGNOSIS — M791 Myalgia, unspecified site: Secondary | ICD-10-CM

## 2017-12-16 DIAGNOSIS — R768 Other specified abnormal immunological findings in serum: Secondary | ICD-10-CM | POA: Diagnosis not present

## 2017-12-16 DIAGNOSIS — Z87891 Personal history of nicotine dependence: Secondary | ICD-10-CM

## 2017-12-16 DIAGNOSIS — M79672 Pain in left foot: Secondary | ICD-10-CM

## 2017-12-16 DIAGNOSIS — M7918 Myalgia, other site: Secondary | ICD-10-CM

## 2017-12-16 DIAGNOSIS — M79641 Pain in right hand: Secondary | ICD-10-CM

## 2017-12-16 DIAGNOSIS — F5101 Primary insomnia: Secondary | ICD-10-CM

## 2017-12-16 DIAGNOSIS — M79642 Pain in left hand: Secondary | ICD-10-CM

## 2017-12-16 DIAGNOSIS — F988 Other specified behavioral and emotional disorders with onset usually occurring in childhood and adolescence: Secondary | ICD-10-CM

## 2017-12-16 DIAGNOSIS — M79671 Pain in right foot: Secondary | ICD-10-CM | POA: Diagnosis not present

## 2017-12-16 DIAGNOSIS — R5383 Other fatigue: Secondary | ICD-10-CM | POA: Diagnosis not present

## 2017-12-16 DIAGNOSIS — I340 Nonrheumatic mitral (valve) insufficiency: Secondary | ICD-10-CM

## 2017-12-17 NOTE — Progress Notes (Signed)
Vit D 50,000 U q wk X3 mths. Repeat level in 3 mths.

## 2017-12-20 ENCOUNTER — Other Ambulatory Visit: Payer: Self-pay

## 2017-12-20 DIAGNOSIS — E559 Vitamin D deficiency, unspecified: Secondary | ICD-10-CM

## 2017-12-20 MED ORDER — VITAMIN D (ERGOCALCIFEROL) 1.25 MG (50000 UNIT) PO CAPS: 50000.0000 [IU] | ORAL_CAPSULE | ORAL | 0 refills | Status: AC

## 2017-12-22 LAB — PROTEIN ELECTROPHORESIS, SERUM, WITH REFLEX
Albumin ELP: 3.8 g/dL (ref 3.8–4.8)
Alpha 1: 0.3 g/dL (ref 0.2–0.3)
Alpha 2: 0.6 g/dL (ref 0.5–0.9)
BETA 2: 0.3 g/dL (ref 0.2–0.5)
BETA GLOBULIN: 0.4 g/dL (ref 0.4–0.6)
GAMMA GLOBULIN: 0.9 g/dL (ref 0.8–1.7)
Total Protein: 6.3 g/dL (ref 6.1–8.1)

## 2017-12-22 LAB — CK: CK TOTAL: 71 U/L (ref 29–143)

## 2017-12-22 LAB — CYCLIC CITRUL PEPTIDE ANTIBODY, IGG: Cyclic Citrullin Peptide Ab: <16 UNITS

## 2017-12-22 LAB — SEDIMENTATION RATE: Sed Rate: 2 mm/h (ref 0–20)

## 2017-12-22 LAB — RHEUMATOID FACTOR: Rhuematoid fact SerPl-aCnc: <14 IU/mL (ref <14)

## 2017-12-22 LAB — VITAMIN D 25 HYDROXY (VIT D DEFICIENCY, FRACTURES): Vit D, 25-Hydroxy: 22 ng/mL — ABNORMAL LOW (ref 30–100)

## 2017-12-22 LAB — IFE INTERPRETATION: Immunofix Electr Int: NOT DETECTED

## 2017-12-23 ENCOUNTER — Encounter: Payer: Self-pay | Admitting: *Deleted

## 2017-12-23 ENCOUNTER — Telehealth: Payer: Self-pay | Admitting: *Deleted

## 2017-12-23 NOTE — Telephone Encounter (Signed)
Patient called in regards to message in mychart reviewed lab results with patient also appt made to come get pnuemovax on 12/31/17 @ 2:30p she also will get blood redrawn for the ones the were not resulted

## 2017-12-28 ENCOUNTER — Encounter: Payer: Self-pay | Admitting: Rheumatology

## 2017-12-31 ENCOUNTER — Ambulatory Visit (INDEPENDENT_AMBULATORY_CARE_PROVIDER_SITE_OTHER): Payer: BLUE CROSS/BLUE SHIELD

## 2017-12-31 DIAGNOSIS — Z23 Encounter for immunization: Secondary | ICD-10-CM | POA: Diagnosis not present

## 2017-12-31 DIAGNOSIS — J329 Chronic sinusitis, unspecified: Secondary | ICD-10-CM

## 2017-12-31 DIAGNOSIS — T781XXD Other adverse food reactions, not elsewhere classified, subsequent encounter: Secondary | ICD-10-CM

## 2017-12-31 MED ORDER — PNEUMOCOCCAL VAC POLYVALENT 25 MCG/0.5ML IJ INJ
0.5000 mL | INJECTION | Freq: Once | INTRAMUSCULAR | Status: AC
  Administered 2017-12-31: 0.5 mL via INTRAMUSCULAR

## 2017-12-31 NOTE — Addendum Note (Signed)
Addended by: Mliss FritzBLACK, Sacheen Arrasmith I on: 12/31/2017 04:08 PM   Modules accepted: Orders

## 2018-01-04 LAB — ALLERGEN PROFILE, SHELLFISH
Clam IgE: <0.1 kU/L
F290-IgE Oyster: <0.1 kU/L
Scallop IgE: <0.1 kU/L
Shrimp IgE: <0.1 kU/L

## 2018-01-04 LAB — ALLERGEN PROFILE, FOOD-FISH
Allergen Walley Pike IgE: <0.1 kU/L
Codfish IgE: <0.1 kU/L
Tuna: <0.1 kU/L

## 2018-01-04 LAB — ALLERGEN SESAME F10: Sesame Seed IgE: <0.1 kU/L

## 2018-01-04 LAB — ALLERGEN, WHEAT, F4: Wheat IgE: <0.1 kU/L

## 2018-01-04 LAB — ALLERGEN MILK: Milk IgE: <0.1 kU/L

## 2018-01-04 LAB — ALLERGEN, PEANUT F13

## 2018-01-04 LAB — ALLERGEN SOYBEAN

## 2018-01-05 DIAGNOSIS — Z6828 Body mass index (BMI) 28.0-28.9, adult: Secondary | ICD-10-CM | POA: Diagnosis not present

## 2018-01-05 DIAGNOSIS — R5382 Chronic fatigue, unspecified: Secondary | ICD-10-CM | POA: Diagnosis not present

## 2018-01-05 DIAGNOSIS — F988 Other specified behavioral and emotional disorders with onset usually occurring in childhood and adolescence: Secondary | ICD-10-CM | POA: Diagnosis not present

## 2018-01-05 DIAGNOSIS — R238 Other skin changes: Secondary | ICD-10-CM | POA: Diagnosis not present

## 2018-01-22 DIAGNOSIS — S61411A Laceration without foreign body of right hand, initial encounter: Secondary | ICD-10-CM | POA: Diagnosis not present

## 2018-01-24 DIAGNOSIS — M950 Acquired deformity of nose: Secondary | ICD-10-CM | POA: Diagnosis not present

## 2018-01-24 DIAGNOSIS — W19XXXD Unspecified fall, subsequent encounter: Secondary | ICD-10-CM | POA: Diagnosis not present

## 2018-01-26 ENCOUNTER — Ambulatory Visit: Payer: Managed Care, Other (non HMO) | Admitting: Rheumatology

## 2018-02-01 DIAGNOSIS — M2550 Pain in unspecified joint: Secondary | ICD-10-CM | POA: Insufficient documentation

## 2018-02-01 DIAGNOSIS — F988 Other specified behavioral and emotional disorders with onset usually occurring in childhood and adolescence: Secondary | ICD-10-CM | POA: Insufficient documentation

## 2018-02-01 DIAGNOSIS — E559 Vitamin D deficiency, unspecified: Secondary | ICD-10-CM | POA: Insufficient documentation

## 2018-02-01 DIAGNOSIS — F5101 Primary insomnia: Secondary | ICD-10-CM | POA: Insufficient documentation

## 2018-02-01 DIAGNOSIS — M7918 Myalgia, other site: Secondary | ICD-10-CM | POA: Insufficient documentation

## 2018-02-01 DIAGNOSIS — R5383 Other fatigue: Secondary | ICD-10-CM | POA: Insufficient documentation

## 2018-02-01 NOTE — Progress Notes (Signed)
Office Visit Note  Patient: Tanya Powers             Date of Birth: 04-Mar-1986           MRN: [id number]             PCP: Lind Guest, NP Referring: Janine Limbo, PA-C Visit Date: 02/15/2018 Occupation: @GUAROCC @  Subjective:  Fatigue and sore throat.   History of Present Illness: Tanya Powers is a 32 y.o. female with history of positive ANA and fatigue.  She states she continues to have extreme fatigue.  She is also having intermittent sore throat.  She denies any joint swelling or joint pain currently.  Her muscle pain has improved since she has been taking vitamins.  Activities of Daily Living:  Patient reports morning stiffness for 0 minute.   Patient Denies nocturnal pain.  Difficulty dressing/grooming: Denies Difficulty climbing stairs: Denies Difficulty getting out of chair: Denies Difficulty using hands for taps, buttons, cutlery, and/or writing: Denies  Review of Systems  Constitutional: Positive for fatigue. Negative for night sweats, weight gain and weight loss.  HENT: Positive for mouth dryness. Negative for mouth sores, trouble swallowing, trouble swallowing and nose dryness.        Related to Adderall  Eyes: Negative for pain, redness, visual disturbance and dryness.  Respiratory: Negative for cough, shortness of breath and difficulty breathing.   Cardiovascular: Positive for palpitations. Negative for chest pain, hypertension, irregular heartbeat and swelling in legs/feet.  Gastrointestinal: Negative for blood in stool, constipation and diarrhea.  Endocrine: Negative for increased urination.  Genitourinary: Negative for vaginal dryness.  Musculoskeletal: Negative for arthralgias, joint pain, joint swelling, myalgias, muscle weakness, morning stiffness, muscle tenderness and myalgias.  Skin: Negative for color change, rash, hair loss, skin tightness, ulcers and sensitivity to sunlight.  Allergic/Immunologic: Negative for susceptible to infections.    Neurological: Negative for dizziness, memory loss, night sweats and weakness.  Hematological: Negative for swollen glands.  Psychiatric/Behavioral: Negative for depressed mood and sleep disturbance. The patient is not nervous/anxious.     PMFS History:  Patient Active Problem List   Diagnosis Date Noted  . Myofascial pain 02/01/2018  . Other fatigue 02/01/2018  . Primary insomnia 02/01/2018  . Hypermobility arthralgia 02/01/2018  . Vitamin D deficiency 02/01/2018  . Attention deficit disorder (ADD) without hyperactivity 02/01/2018  . ABDOMINAL PAIN, UNSPECIFIED SITE 11/26/2009  . MITRAL REGURGITATION 10/15/2009  . Former smoker 07/02/2006  . COUGH 07/02/2006  . Nonspecific (abnormal) findings on radiological and other examination of body structure 07/02/2006  . ABFND, RADIOLOGICAL, LUNG FIELD 07/02/2006    Past Medical History:  Diagnosis Date  . Anemia   . Anxiety   . Menstrual bleeding problem    dysfunction. heavy menses  . Tobacco abuse   . Urticaria     Family History  Problem Relation Age of Onset  . Hypertension Unknown        both parents (who are otherwise alive and well)  . Diabetes Maternal Grandmother   . Heart disease Maternal Grandfather        also had rectal cancer    Past Surgical History:  Procedure Laterality Date  . CESAREAN SECTION    . KNEE SURGERY     L 2007  . TYMPANOSTOMY TUBE PLACEMENT     Social History   Social History Narrative   Single, 1 child.    Works in [employer]   Daily caffeine - 2 servings per day   Gets regular  exercise.     Objective: Vital Signs: BP (!) 144/85 (BP Location: Left Arm, Patient Position: Sitting, Cuff Size: Normal)   Pulse 80   Resp 14   Ht 5' 2"  (1.575 m)   Wt 161 lb 3.2 oz (73.1 kg)   BMI 29.48 kg/m    Physical Exam  Constitutional: She is oriented to person, place, and time. She appears well-developed and well-nourished.  HENT:  Head: Normocephalic and atraumatic.  Her tonsils were  not enlarged or erythematous but had some exudates.  Eyes: Conjunctivae and EOM are normal.  Neck: Normal range of motion.  Cardiovascular: Normal rate, regular rhythm, normal heart sounds and intact distal pulses.  Pulmonary/Chest: Effort normal and breath sounds normal.  Abdominal: Soft. Bowel sounds are normal.  Lymphadenopathy:    She has no cervical adenopathy.  Neurological: She is alert and oriented to person, place, and time.  Skin: Skin is warm and dry. Capillary refill takes less than 2 seconds.  Psychiatric: She has a normal mood and affect. Her behavior is normal.  Nursing note and vitals reviewed.    Musculoskeletal Exam: Spine thoracic lumbar spine good range of motion.  Shoulder joints, elbow joints, wrist and MCPs PIPs DIPs were in good range of motion with no synovitis.  Hip joints, knee joints, ankles and MTPs PIPs were good range of motion with no synovitis.  She does have hypermobility and few tender points.  CDAI Exam: CDAI Score: Not documented Patient Global Assessment: Not documented; Provider Global Assessment: Not documented Swollen: Not documented; Tender: Not documented Joint Exam   Not documented   There is currently no information documented on the homunculus. Go to the Rheumatology activity and complete the homunculus joint exam.  Investigation: No additional findings.  Imaging: No results found.  Recent Labs: Lab Results  Component Value Date   WBC 6.7 12/02/2017   HGB 14.1 12/02/2017   PLT 295 12/02/2017   NA 146 (H) 09/07/2009   K 3.5 09/07/2009   CL 104 09/07/2009   CO2 31 09/07/2009   GLUCOSE 82 09/07/2009   BUN 10 09/07/2009   CREATININE 0.6 09/07/2009   BILITOT 0.6 12/12/2009   ALKPHOS 71 12/12/2009   AST 18 12/12/2009   ALT 16 12/12/2009   PROT 6.3 12/16/2017   ALBUMIN 4.0 12/12/2009   CALCIUM 9.0 09/07/2009   GFRAA  09/07/2009    >60        The eGFR has been calculated using the MDRD equation. This calculation has not  been validated in all clinical situations. eGFR's persistently <60 mL/min signify possible Chronic Kidney Disease.  December 16, 2017 IFE negative, CK 71, ESR 2, RF negative, anti-CCP negative, vitamin D 22  Speciality Comments: No specialty comments available.  Procedures:  No procedures performed Allergies: Buspar [buspirone]   Assessment / Plan:     Visit Diagnoses: Positive ANA (antinuclear antibody) - History of intermittent hives and sicca symptoms, AVISE index not done so far.  Patient states she will get labs this week.  Patient reports that her sicca symptoms are related to Adderall.  She has not had any recent hives.  Chronic pharyngitis-she complains of chronic recurrent pharyngitis.  Today on exam I could see exudates on her tonsils.  I have advised her to follow-up with her PCP.  Myofascial pain - CK normal.  She does have some arthralgias and myalgias.  She believes her symptoms have improved since she has been taking vitamin D.  Other fatigue-he complains of severe fatigue.  Primary insomnia-she has history of chronic insomnia.  Hypermobility arthralgia-she does have hypermobility which can contribute to arthralgias.  Vitamin D deficiency-patient states that she has been taking over-the-counter vitamin D.  Former smoker  Attention deficit disorder (ADD) without hyperactivity   Orders: No orders of the defined types were placed in this encounter.  No orders of the defined types were placed in this encounter.   Face-to-face time spent with patient was 30 minutes. Greater than 50% of time was spent in counseling and coordination of care.  Follow-Up Instructions: Return in about 4 weeks (around 03/15/2018) for +ANA, MFPS.   Bo Merino, MD  Note - This record has been created using Editor, commissioning.  Chart creation errors have been sought, but may not always  have been located. Such creation errors do not reflect on  the standard of medical care.

## 2018-02-15 ENCOUNTER — Encounter: Payer: Self-pay | Admitting: Rheumatology

## 2018-02-15 ENCOUNTER — Ambulatory Visit: Payer: BLUE CROSS/BLUE SHIELD | Admitting: Rheumatology

## 2018-02-15 VITALS — BP 144/85 | HR 80 | Resp 14 | Ht 62.0 in | Wt 161.2 lb

## 2018-02-15 DIAGNOSIS — F5101 Primary insomnia: Secondary | ICD-10-CM

## 2018-02-15 DIAGNOSIS — R768 Other specified abnormal immunological findings in serum: Secondary | ICD-10-CM | POA: Diagnosis not present

## 2018-02-15 DIAGNOSIS — E559 Vitamin D deficiency, unspecified: Secondary | ICD-10-CM

## 2018-02-15 DIAGNOSIS — R5383 Other fatigue: Secondary | ICD-10-CM | POA: Diagnosis not present

## 2018-02-15 DIAGNOSIS — M7918 Myalgia, other site: Secondary | ICD-10-CM | POA: Diagnosis not present

## 2018-02-15 DIAGNOSIS — Z87891 Personal history of nicotine dependence: Secondary | ICD-10-CM

## 2018-02-15 DIAGNOSIS — J312 Chronic pharyngitis: Secondary | ICD-10-CM | POA: Diagnosis not present

## 2018-02-15 DIAGNOSIS — F988 Other specified behavioral and emotional disorders with onset usually occurring in childhood and adolescence: Secondary | ICD-10-CM

## 2018-02-15 DIAGNOSIS — M2550 Pain in unspecified joint: Secondary | ICD-10-CM

## 2018-02-28 NOTE — Progress Notes (Deleted)
Office Visit Note  Patient: Tanya Powers             Date of Birth: 12-Feb-1986           MRN: [id number]             PCP: Lind Guest, NP Referring: Lind Guest, NP Visit Date: 03/14/2018 Occupation: @GUAROCC @  Subjective:  No chief complaint on file.   History of Present Illness: Tanya Powers is a 32 y.o. female ***   Activities of Daily Living:  Patient reports morning stiffness for *** {minute/hour:19697}.   Patient {ACTIONS;DENIES/REPORTS:21021675::"Denies"} nocturnal pain.  Difficulty dressing/grooming: {ACTIONS;DENIES/REPORTS:21021675::"Denies"} Difficulty climbing stairs: {ACTIONS;DENIES/REPORTS:21021675::"Denies"} Difficulty getting out of chair: {ACTIONS;DENIES/REPORTS:21021675::"Denies"} Difficulty using hands for taps, buttons, cutlery, and/or writing: {ACTIONS;DENIES/REPORTS:21021675::"Denies"}  No Rheumatology ROS completed.   PMFS History:  Patient Active Problem List   Diagnosis Date Noted  . Myofascial pain 02/01/2018  . Other fatigue 02/01/2018  . Primary insomnia 02/01/2018  . Hypermobility arthralgia 02/01/2018  . Vitamin D deficiency 02/01/2018  . Attention deficit disorder (ADD) without hyperactivity 02/01/2018  . ABDOMINAL PAIN, UNSPECIFIED SITE 11/26/2009  . MITRAL REGURGITATION 10/15/2009  . Former smoker 07/02/2006  . COUGH 07/02/2006  . Nonspecific (abnormal) findings on radiological and other examination of body structure 07/02/2006  . ABFND, RADIOLOGICAL, LUNG FIELD 07/02/2006    Past Medical History:  Diagnosis Date  . Anemia   . Anxiety   . Menstrual bleeding problem    dysfunction. heavy menses  . Tobacco abuse   . Urticaria     Family History  Problem Relation Age of Onset  . Hypertension Unknown        both parents (who are otherwise alive and well)  . Diabetes Maternal Grandmother   . Heart disease Maternal Grandfather        also had rectal cancer    Past Surgical History:  Procedure Laterality Date  .  CESAREAN SECTION    . KNEE SURGERY     L 2007  . TYMPANOSTOMY TUBE PLACEMENT     Social History   Social History Narrative   Single, 1 child.    Works in [employer]   Daily caffeine - 2 servings per day   Gets regular exercise.     Objective: Vital Signs: There were no vitals taken for this visit.   Physical Exam   Musculoskeletal Exam: ***  CDAI Exam: CDAI Score: Not documented Patient Global Assessment: Not documented; Provider Global Assessment: Not documented Swollen: Not documented; Tender: Not documented Joint Exam   Not documented   There is currently no information documented on the homunculus. Go to the Rheumatology activity and complete the homunculus joint exam.  Investigation: No additional findings.  Imaging: No results found.  Recent Labs: Lab Results  Component Value Date   WBC 6.7 12/02/2017   HGB 14.1 12/02/2017   PLT 295 12/02/2017   NA 146 (H) 09/07/2009   K 3.5 09/07/2009   CL 104 09/07/2009   CO2 31 09/07/2009   GLUCOSE 82 09/07/2009   BUN 10 09/07/2009   CREATININE 0.6 09/07/2009   BILITOT 0.6 12/12/2009   ALKPHOS 71 12/12/2009   AST 18 12/12/2009   ALT 16 12/12/2009   PROT 6.3 12/16/2017   ALBUMIN 4.0 12/12/2009   CALCIUM 9.0 09/07/2009   GFRAA  09/07/2009    >60        The eGFR has been calculated using the MDRD equation. This calculation has not been validated in all clinical situations. eGFR's  persistently <60 mL/min signify possible Chronic Kidney Disease.    Speciality Comments: No specialty comments available.  Procedures:  No procedures performed Allergies: Buspar [buspirone]   Assessment / Plan:     Visit Diagnoses: Positive ANA (antinuclear antibody) - sicca related to Adderall AVISE ordered  Myofascial pain - CK WNL  Other fatigue  Primary insomnia  Hypermobility arthralgia  Vitamin D deficiency  Attention deficit disorder (ADD) without hyperactivity  Former smoker  History of iron  deficiency anemia   Orders: No orders of the defined types were placed in this encounter.  No orders of the defined types were placed in this encounter.   Face-to-face time spent with patient was *** minutes. Greater than 50% of time was spent in counseling and coordination of care.  Follow-Up Instructions: No follow-ups on file.   Ofilia Neas, PA-C  Note - This record has been created using Dragon software.  Chart creation errors have been sought, but may not always  have been located. Such creation errors do not reflect on  the standard of medical care.

## 2018-03-14 ENCOUNTER — Ambulatory Visit: Payer: BLUE CROSS/BLUE SHIELD | Admitting: Physician Assistant

## 2018-03-14 DIAGNOSIS — M79642 Pain in left hand: Secondary | ICD-10-CM | POA: Diagnosis not present

## 2018-03-14 DIAGNOSIS — S61217A Laceration without foreign body of left little finger without damage to nail, initial encounter: Secondary | ICD-10-CM | POA: Diagnosis not present

## 2018-03-14 DIAGNOSIS — Z89022 Acquired absence of left finger(s): Secondary | ICD-10-CM | POA: Diagnosis not present

## 2018-03-15 DIAGNOSIS — S68129A Partial traumatic metacarpophalangeal amputation of unspecified finger, initial encounter: Secondary | ICD-10-CM | POA: Diagnosis not present

## 2018-03-21 DIAGNOSIS — M79642 Pain in left hand: Secondary | ICD-10-CM | POA: Diagnosis not present

## 2018-03-21 DIAGNOSIS — S68627D Partial traumatic transphalangeal amputation of left little finger, subsequent encounter: Secondary | ICD-10-CM | POA: Diagnosis not present

## 2018-03-21 DIAGNOSIS — M25642 Stiffness of left hand, not elsewhere classified: Secondary | ICD-10-CM | POA: Diagnosis not present

## 2018-03-21 DIAGNOSIS — S61402D Unspecified open wound of left hand, subsequent encounter: Secondary | ICD-10-CM | POA: Diagnosis not present

## 2018-03-23 DIAGNOSIS — M79642 Pain in left hand: Secondary | ICD-10-CM | POA: Diagnosis not present

## 2018-03-23 DIAGNOSIS — S68627D Partial traumatic transphalangeal amputation of left little finger, subsequent encounter: Secondary | ICD-10-CM | POA: Diagnosis not present

## 2018-03-23 DIAGNOSIS — M25642 Stiffness of left hand, not elsewhere classified: Secondary | ICD-10-CM | POA: Diagnosis not present

## 2018-03-23 DIAGNOSIS — S61402D Unspecified open wound of left hand, subsequent encounter: Secondary | ICD-10-CM | POA: Diagnosis not present

## 2018-03-25 DIAGNOSIS — S68627D Partial traumatic transphalangeal amputation of left little finger, subsequent encounter: Secondary | ICD-10-CM | POA: Diagnosis not present

## 2018-03-25 DIAGNOSIS — S61402D Unspecified open wound of left hand, subsequent encounter: Secondary | ICD-10-CM | POA: Diagnosis not present

## 2018-03-25 DIAGNOSIS — M79642 Pain in left hand: Secondary | ICD-10-CM | POA: Diagnosis not present

## 2018-03-25 DIAGNOSIS — M25642 Stiffness of left hand, not elsewhere classified: Secondary | ICD-10-CM | POA: Diagnosis not present

## 2018-03-28 DIAGNOSIS — M79642 Pain in left hand: Secondary | ICD-10-CM | POA: Diagnosis not present

## 2018-03-28 DIAGNOSIS — S68627D Partial traumatic transphalangeal amputation of left little finger, subsequent encounter: Secondary | ICD-10-CM | POA: Diagnosis not present

## 2018-03-28 DIAGNOSIS — S61402D Unspecified open wound of left hand, subsequent encounter: Secondary | ICD-10-CM | POA: Diagnosis not present

## 2018-03-28 DIAGNOSIS — M25642 Stiffness of left hand, not elsewhere classified: Secondary | ICD-10-CM | POA: Diagnosis not present

## 2018-03-29 DIAGNOSIS — Z6829 Body mass index (BMI) 29.0-29.9, adult: Secondary | ICD-10-CM | POA: Diagnosis not present

## 2018-03-29 DIAGNOSIS — R5382 Chronic fatigue, unspecified: Secondary | ICD-10-CM | POA: Diagnosis not present

## 2018-03-29 DIAGNOSIS — J029 Acute pharyngitis, unspecified: Secondary | ICD-10-CM | POA: Diagnosis not present

## 2018-03-29 DIAGNOSIS — F988 Other specified behavioral and emotional disorders with onset usually occurring in childhood and adolescence: Secondary | ICD-10-CM | POA: Diagnosis not present

## 2018-03-30 DIAGNOSIS — S61402D Unspecified open wound of left hand, subsequent encounter: Secondary | ICD-10-CM | POA: Diagnosis not present

## 2018-03-30 DIAGNOSIS — M25642 Stiffness of left hand, not elsewhere classified: Secondary | ICD-10-CM | POA: Diagnosis not present

## 2018-03-30 DIAGNOSIS — M79642 Pain in left hand: Secondary | ICD-10-CM | POA: Diagnosis not present

## 2018-03-30 DIAGNOSIS — S68627D Partial traumatic transphalangeal amputation of left little finger, subsequent encounter: Secondary | ICD-10-CM | POA: Diagnosis not present

## 2018-04-03 DIAGNOSIS — M25642 Stiffness of left hand, not elsewhere classified: Secondary | ICD-10-CM | POA: Diagnosis not present

## 2018-04-03 DIAGNOSIS — S61402D Unspecified open wound of left hand, subsequent encounter: Secondary | ICD-10-CM | POA: Diagnosis not present

## 2018-04-03 DIAGNOSIS — M79642 Pain in left hand: Secondary | ICD-10-CM | POA: Diagnosis not present

## 2018-04-03 DIAGNOSIS — S68627D Partial traumatic transphalangeal amputation of left little finger, subsequent encounter: Secondary | ICD-10-CM | POA: Diagnosis not present

## 2018-04-05 DIAGNOSIS — M79642 Pain in left hand: Secondary | ICD-10-CM | POA: Diagnosis not present

## 2018-04-05 DIAGNOSIS — S68627D Partial traumatic transphalangeal amputation of left little finger, subsequent encounter: Secondary | ICD-10-CM | POA: Diagnosis not present

## 2018-04-05 DIAGNOSIS — M25642 Stiffness of left hand, not elsewhere classified: Secondary | ICD-10-CM | POA: Diagnosis not present

## 2018-04-05 DIAGNOSIS — S61402D Unspecified open wound of left hand, subsequent encounter: Secondary | ICD-10-CM | POA: Diagnosis not present

## 2018-04-21 DIAGNOSIS — M79642 Pain in left hand: Secondary | ICD-10-CM | POA: Diagnosis not present

## 2018-04-21 DIAGNOSIS — M25642 Stiffness of left hand, not elsewhere classified: Secondary | ICD-10-CM | POA: Diagnosis not present

## 2018-04-21 DIAGNOSIS — S61402D Unspecified open wound of left hand, subsequent encounter: Secondary | ICD-10-CM | POA: Diagnosis not present

## 2018-04-21 DIAGNOSIS — S68627D Partial traumatic transphalangeal amputation of left little finger, subsequent encounter: Secondary | ICD-10-CM | POA: Diagnosis not present

## 2018-04-26 DIAGNOSIS — R5382 Chronic fatigue, unspecified: Secondary | ICD-10-CM | POA: Diagnosis not present

## 2018-04-26 DIAGNOSIS — M898X9 Other specified disorders of bone, unspecified site: Secondary | ICD-10-CM | POA: Diagnosis not present

## 2018-04-26 DIAGNOSIS — E559 Vitamin D deficiency, unspecified: Secondary | ICD-10-CM | POA: Diagnosis not present

## 2018-04-26 DIAGNOSIS — D509 Iron deficiency anemia, unspecified: Secondary | ICD-10-CM | POA: Diagnosis not present

## 2018-04-27 DIAGNOSIS — M25642 Stiffness of left hand, not elsewhere classified: Secondary | ICD-10-CM | POA: Diagnosis not present

## 2018-04-27 DIAGNOSIS — S61402D Unspecified open wound of left hand, subsequent encounter: Secondary | ICD-10-CM | POA: Diagnosis not present

## 2018-04-27 DIAGNOSIS — M79642 Pain in left hand: Secondary | ICD-10-CM | POA: Diagnosis not present

## 2018-04-27 DIAGNOSIS — S68627D Partial traumatic transphalangeal amputation of left little finger, subsequent encounter: Secondary | ICD-10-CM | POA: Diagnosis not present

## 2018-04-29 DIAGNOSIS — M79642 Pain in left hand: Secondary | ICD-10-CM | POA: Diagnosis not present

## 2018-04-29 DIAGNOSIS — S61402D Unspecified open wound of left hand, subsequent encounter: Secondary | ICD-10-CM | POA: Diagnosis not present

## 2018-04-29 DIAGNOSIS — S68627D Partial traumatic transphalangeal amputation of left little finger, subsequent encounter: Secondary | ICD-10-CM | POA: Diagnosis not present

## 2018-04-29 DIAGNOSIS — M25642 Stiffness of left hand, not elsewhere classified: Secondary | ICD-10-CM | POA: Diagnosis not present

## 2018-05-01 DIAGNOSIS — S68627D Partial traumatic transphalangeal amputation of left little finger, subsequent encounter: Secondary | ICD-10-CM | POA: Diagnosis not present

## 2018-05-01 DIAGNOSIS — M25642 Stiffness of left hand, not elsewhere classified: Secondary | ICD-10-CM | POA: Diagnosis not present

## 2018-05-01 DIAGNOSIS — S61402D Unspecified open wound of left hand, subsequent encounter: Secondary | ICD-10-CM | POA: Diagnosis not present

## 2018-05-01 DIAGNOSIS — M79642 Pain in left hand: Secondary | ICD-10-CM | POA: Diagnosis not present

## 2018-05-06 DIAGNOSIS — M25642 Stiffness of left hand, not elsewhere classified: Secondary | ICD-10-CM | POA: Diagnosis not present

## 2018-05-06 DIAGNOSIS — S68627D Partial traumatic transphalangeal amputation of left little finger, subsequent encounter: Secondary | ICD-10-CM | POA: Diagnosis not present

## 2018-05-06 DIAGNOSIS — S61402D Unspecified open wound of left hand, subsequent encounter: Secondary | ICD-10-CM | POA: Diagnosis not present

## 2018-05-06 DIAGNOSIS — M79642 Pain in left hand: Secondary | ICD-10-CM | POA: Diagnosis not present

## 2018-05-16 DIAGNOSIS — D509 Iron deficiency anemia, unspecified: Secondary | ICD-10-CM | POA: Diagnosis not present

## 2018-05-23 DIAGNOSIS — D509 Iron deficiency anemia, unspecified: Secondary | ICD-10-CM | POA: Diagnosis not present

## 2018-05-27 DIAGNOSIS — S61402D Unspecified open wound of left hand, subsequent encounter: Secondary | ICD-10-CM | POA: Diagnosis not present

## 2018-05-27 DIAGNOSIS — M25642 Stiffness of left hand, not elsewhere classified: Secondary | ICD-10-CM | POA: Diagnosis not present

## 2018-05-27 DIAGNOSIS — M79642 Pain in left hand: Secondary | ICD-10-CM | POA: Diagnosis not present

## 2018-05-27 DIAGNOSIS — S68627D Partial traumatic transphalangeal amputation of left little finger, subsequent encounter: Secondary | ICD-10-CM | POA: Diagnosis not present

## 2018-08-04 DIAGNOSIS — R635 Abnormal weight gain: Secondary | ICD-10-CM | POA: Diagnosis not present

## 2018-08-04 DIAGNOSIS — L853 Xerosis cutis: Secondary | ICD-10-CM | POA: Diagnosis not present

## 2018-08-04 DIAGNOSIS — D509 Iron deficiency anemia, unspecified: Secondary | ICD-10-CM | POA: Diagnosis not present

## 2018-08-04 DIAGNOSIS — H04123 Dry eye syndrome of bilateral lacrimal glands: Secondary | ICD-10-CM | POA: Diagnosis not present

## 2018-08-04 DIAGNOSIS — R202 Paresthesia of skin: Secondary | ICD-10-CM | POA: Diagnosis not present

## 2018-08-04 DIAGNOSIS — E559 Vitamin D deficiency, unspecified: Secondary | ICD-10-CM | POA: Diagnosis not present

## 2018-11-21 DIAGNOSIS — F988 Other specified behavioral and emotional disorders with onset usually occurring in childhood and adolescence: Secondary | ICD-10-CM | POA: Diagnosis not present

## 2018-11-21 DIAGNOSIS — Z79899 Other long term (current) drug therapy: Secondary | ICD-10-CM | POA: Diagnosis not present

## 2018-11-21 DIAGNOSIS — B2790 Infectious mononucleosis, unspecified without complication: Secondary | ICD-10-CM | POA: Diagnosis not present

## 2018-12-01 DIAGNOSIS — B2790 Infectious mononucleosis, unspecified without complication: Secondary | ICD-10-CM | POA: Diagnosis not present

## 2018-12-01 DIAGNOSIS — M2550 Pain in unspecified joint: Secondary | ICD-10-CM | POA: Diagnosis not present

## 2018-12-01 DIAGNOSIS — Z79899 Other long term (current) drug therapy: Secondary | ICD-10-CM | POA: Diagnosis not present

## 2018-12-01 DIAGNOSIS — R5382 Chronic fatigue, unspecified: Secondary | ICD-10-CM | POA: Diagnosis not present

## 2018-12-30 DIAGNOSIS — H5213 Myopia, bilateral: Secondary | ICD-10-CM | POA: Diagnosis not present

## 2019-01-17 DIAGNOSIS — R768 Other specified abnormal immunological findings in serum: Secondary | ICD-10-CM | POA: Diagnosis not present

## 2019-01-17 DIAGNOSIS — M3501 Sicca syndrome with keratoconjunctivitis: Secondary | ICD-10-CM | POA: Diagnosis not present

## 2020-04-10 ENCOUNTER — Encounter: Payer: Self-pay | Admitting: Nurse Practitioner

## 2020-04-10 ENCOUNTER — Telehealth: Payer: Self-pay | Admitting: Nurse Practitioner

## 2020-04-10 DIAGNOSIS — U071 COVID-19: Secondary | ICD-10-CM

## 2020-04-10 NOTE — Telephone Encounter (Signed)
Called to Discuss with patient about Covid symptoms and the use of a monoclonal antibody infusion for those with mild to moderate Covid symptoms and at a high risk of hospitalization.     Pt is qualified for this infusion at the [facility] center due to co-morbid conditions and/or a member of an at-risk group.     Unable to reach pt. VM unavailable. Sent [employer].   Consuello Masse, DNP, AGNP-C [phone] (Infusion Center Hotline)

## 2021-11-18 DIAGNOSIS — F4322 Adjustment disorder with anxiety: Secondary | ICD-10-CM | POA: Diagnosis not present

## 2021-11-26 DIAGNOSIS — R5382 Chronic fatigue, unspecified: Secondary | ICD-10-CM | POA: Diagnosis not present

## 2021-11-26 DIAGNOSIS — F988 Other specified behavioral and emotional disorders with onset usually occurring in childhood and adolescence: Secondary | ICD-10-CM | POA: Diagnosis not present

## 2021-11-26 DIAGNOSIS — Z6834 Body mass index (BMI) 34.0-34.9, adult: Secondary | ICD-10-CM | POA: Diagnosis not present

## 2021-11-26 DIAGNOSIS — F4322 Adjustment disorder with anxiety: Secondary | ICD-10-CM | POA: Diagnosis not present

## 2021-11-26 DIAGNOSIS — D509 Iron deficiency anemia, unspecified: Secondary | ICD-10-CM | POA: Diagnosis not present

## 2021-12-03 DIAGNOSIS — R202 Paresthesia of skin: Secondary | ICD-10-CM | POA: Diagnosis not present

## 2021-12-03 DIAGNOSIS — E611 Iron deficiency: Secondary | ICD-10-CM | POA: Diagnosis not present

## 2021-12-08 DIAGNOSIS — F4322 Adjustment disorder with anxiety: Secondary | ICD-10-CM | POA: Diagnosis not present

## 2021-12-15 DIAGNOSIS — F4322 Adjustment disorder with anxiety: Secondary | ICD-10-CM | POA: Diagnosis not present

## 2021-12-16 DIAGNOSIS — E538 Deficiency of other specified B group vitamins: Secondary | ICD-10-CM | POA: Diagnosis not present

## 2021-12-22 DIAGNOSIS — F4322 Adjustment disorder with anxiety: Secondary | ICD-10-CM | POA: Diagnosis not present

## 2021-12-22 DIAGNOSIS — D509 Iron deficiency anemia, unspecified: Secondary | ICD-10-CM | POA: Diagnosis not present

## 2021-12-29 DIAGNOSIS — D509 Iron deficiency anemia, unspecified: Secondary | ICD-10-CM | POA: Diagnosis not present

## 2021-12-29 DIAGNOSIS — F4322 Adjustment disorder with anxiety: Secondary | ICD-10-CM | POA: Diagnosis not present

## 2022-01-13 DIAGNOSIS — E538 Deficiency of other specified B group vitamins: Secondary | ICD-10-CM | POA: Diagnosis not present

## 2022-01-19 DIAGNOSIS — F4322 Adjustment disorder with anxiety: Secondary | ICD-10-CM | POA: Diagnosis not present

## 2022-02-02 DIAGNOSIS — F4322 Adjustment disorder with anxiety: Secondary | ICD-10-CM | POA: Diagnosis not present

## 2022-02-09 DIAGNOSIS — F4322 Adjustment disorder with anxiety: Secondary | ICD-10-CM | POA: Diagnosis not present

## 2022-02-10 DIAGNOSIS — E538 Deficiency of other specified B group vitamins: Secondary | ICD-10-CM | POA: Diagnosis not present

## 2022-02-16 DIAGNOSIS — F4322 Adjustment disorder with anxiety: Secondary | ICD-10-CM | POA: Diagnosis not present

## 2022-02-23 DIAGNOSIS — F4322 Adjustment disorder with anxiety: Secondary | ICD-10-CM | POA: Diagnosis not present

## 2022-02-25 DIAGNOSIS — M3500 Sicca syndrome, unspecified: Secondary | ICD-10-CM | POA: Diagnosis not present

## 2022-02-25 DIAGNOSIS — E538 Deficiency of other specified B group vitamins: Secondary | ICD-10-CM | POA: Diagnosis not present

## 2022-02-25 DIAGNOSIS — Z13220 Encounter for screening for lipoid disorders: Secondary | ICD-10-CM | POA: Diagnosis not present

## 2022-02-25 DIAGNOSIS — Z131 Encounter for screening for diabetes mellitus: Secondary | ICD-10-CM | POA: Diagnosis not present

## 2022-02-25 DIAGNOSIS — Z0000 Encounter for general adult medical examination without abnormal findings: Secondary | ICD-10-CM | POA: Diagnosis not present

## 2022-02-25 DIAGNOSIS — Z1331 Encounter for screening for depression: Secondary | ICD-10-CM | POA: Diagnosis not present

## 2022-02-25 DIAGNOSIS — F909 Attention-deficit hyperactivity disorder, unspecified type: Secondary | ICD-10-CM | POA: Diagnosis not present

## 2022-03-02 DIAGNOSIS — F4322 Adjustment disorder with anxiety: Secondary | ICD-10-CM | POA: Diagnosis not present

## 2022-03-16 DIAGNOSIS — F4322 Adjustment disorder with anxiety: Secondary | ICD-10-CM | POA: Diagnosis not present

## 2022-03-19 DIAGNOSIS — E538 Deficiency of other specified B group vitamins: Secondary | ICD-10-CM | POA: Diagnosis not present

## 2022-04-06 DIAGNOSIS — F4322 Adjustment disorder with anxiety: Secondary | ICD-10-CM | POA: Diagnosis not present

## 2022-04-16 DIAGNOSIS — D519 Vitamin B12 deficiency anemia, unspecified: Secondary | ICD-10-CM | POA: Diagnosis not present

## 2022-04-23 DIAGNOSIS — F4322 Adjustment disorder with anxiety: Secondary | ICD-10-CM | POA: Diagnosis not present

## 2022-05-06 DIAGNOSIS — F4322 Adjustment disorder with anxiety: Secondary | ICD-10-CM | POA: Diagnosis not present

## 2022-05-14 DIAGNOSIS — E538 Deficiency of other specified B group vitamins: Secondary | ICD-10-CM | POA: Diagnosis not present

## 2022-05-20 DIAGNOSIS — F4322 Adjustment disorder with anxiety: Secondary | ICD-10-CM | POA: Diagnosis not present

## 2022-05-27 DIAGNOSIS — E559 Vitamin D deficiency, unspecified: Secondary | ICD-10-CM | POA: Diagnosis not present

## 2022-05-27 DIAGNOSIS — E538 Deficiency of other specified B group vitamins: Secondary | ICD-10-CM | POA: Diagnosis not present

## 2022-05-27 DIAGNOSIS — F988 Other specified behavioral and emotional disorders with onset usually occurring in childhood and adolescence: Secondary | ICD-10-CM | POA: Diagnosis not present

## 2022-05-27 DIAGNOSIS — D509 Iron deficiency anemia, unspecified: Secondary | ICD-10-CM | POA: Diagnosis not present

## 2022-06-04 DIAGNOSIS — F4322 Adjustment disorder with anxiety: Secondary | ICD-10-CM | POA: Diagnosis not present

## 2022-06-11 DIAGNOSIS — E538 Deficiency of other specified B group vitamins: Secondary | ICD-10-CM | POA: Diagnosis not present

## 2022-06-25 DIAGNOSIS — F4322 Adjustment disorder with anxiety: Secondary | ICD-10-CM | POA: Diagnosis not present

## 2022-07-09 DIAGNOSIS — E538 Deficiency of other specified B group vitamins: Secondary | ICD-10-CM | POA: Diagnosis not present

## 2022-07-09 DIAGNOSIS — F4322 Adjustment disorder with anxiety: Secondary | ICD-10-CM | POA: Diagnosis not present

## 2022-08-06 DIAGNOSIS — E538 Deficiency of other specified B group vitamins: Secondary | ICD-10-CM | POA: Diagnosis not present

## 2022-08-18 DIAGNOSIS — F4322 Adjustment disorder with anxiety: Secondary | ICD-10-CM | POA: Diagnosis not present

## 2022-08-26 DIAGNOSIS — F4322 Adjustment disorder with anxiety: Secondary | ICD-10-CM | POA: Diagnosis not present

## 2022-09-03 DIAGNOSIS — E538 Deficiency of other specified B group vitamins: Secondary | ICD-10-CM | POA: Diagnosis not present

## 2022-11-05 DIAGNOSIS — S61451A Open bite of right hand, initial encounter: Secondary | ICD-10-CM | POA: Diagnosis not present

## 2022-11-05 DIAGNOSIS — Z23 Encounter for immunization: Secondary | ICD-10-CM | POA: Diagnosis not present

## 2022-11-05 DIAGNOSIS — W540XXA Bitten by dog, initial encounter: Secondary | ICD-10-CM | POA: Diagnosis not present

## 2022-11-16 DIAGNOSIS — Z6835 Body mass index (BMI) 35.0-35.9, adult: Secondary | ICD-10-CM | POA: Diagnosis not present

## 2022-11-16 DIAGNOSIS — I10 Essential (primary) hypertension: Secondary | ICD-10-CM | POA: Diagnosis not present

## 2022-11-19 DIAGNOSIS — E559 Vitamin D deficiency, unspecified: Secondary | ICD-10-CM | POA: Diagnosis not present

## 2022-11-19 DIAGNOSIS — E538 Deficiency of other specified B group vitamins: Secondary | ICD-10-CM | POA: Diagnosis not present

## 2022-11-19 DIAGNOSIS — D509 Iron deficiency anemia, unspecified: Secondary | ICD-10-CM | POA: Diagnosis not present

## 2022-11-19 DIAGNOSIS — I10 Essential (primary) hypertension: Secondary | ICD-10-CM | POA: Diagnosis not present

## 2022-12-01 DIAGNOSIS — I10 Essential (primary) hypertension: Secondary | ICD-10-CM | POA: Diagnosis not present

## 2022-12-01 DIAGNOSIS — F988 Other specified behavioral and emotional disorders with onset usually occurring in childhood and adolescence: Secondary | ICD-10-CM | POA: Diagnosis not present

## 2022-12-01 DIAGNOSIS — E538 Deficiency of other specified B group vitamins: Secondary | ICD-10-CM | POA: Diagnosis not present

## 2022-12-01 DIAGNOSIS — D509 Iron deficiency anemia, unspecified: Secondary | ICD-10-CM | POA: Diagnosis not present

## 2022-12-02 DIAGNOSIS — D509 Iron deficiency anemia, unspecified: Secondary | ICD-10-CM | POA: Diagnosis not present

## 2022-12-07 DIAGNOSIS — E538 Deficiency of other specified B group vitamins: Secondary | ICD-10-CM | POA: Diagnosis not present

## 2022-12-09 DIAGNOSIS — D509 Iron deficiency anemia, unspecified: Secondary | ICD-10-CM | POA: Diagnosis not present

## 2023-04-05 DIAGNOSIS — H60501 Unspecified acute noninfective otitis externa, right ear: Secondary | ICD-10-CM | POA: Diagnosis not present

## 2023-04-07 DIAGNOSIS — H60501 Unspecified acute noninfective otitis externa, right ear: Secondary | ICD-10-CM | POA: Diagnosis not present

## 2023-06-03 DIAGNOSIS — E559 Vitamin D deficiency, unspecified: Secondary | ICD-10-CM | POA: Diagnosis not present

## 2023-06-03 DIAGNOSIS — F988 Other specified behavioral and emotional disorders with onset usually occurring in childhood and adolescence: Secondary | ICD-10-CM | POA: Diagnosis not present

## 2023-06-03 DIAGNOSIS — L509 Urticaria, unspecified: Secondary | ICD-10-CM | POA: Diagnosis not present

## 2023-06-03 DIAGNOSIS — Z79899 Other long term (current) drug therapy: Secondary | ICD-10-CM | POA: Diagnosis not present

## 2023-06-03 DIAGNOSIS — D509 Iron deficiency anemia, unspecified: Secondary | ICD-10-CM | POA: Diagnosis not present

## 2023-06-03 DIAGNOSIS — E538 Deficiency of other specified B group vitamins: Secondary | ICD-10-CM | POA: Diagnosis not present

## 2023-06-03 DIAGNOSIS — I10 Essential (primary) hypertension: Secondary | ICD-10-CM | POA: Diagnosis not present

## 2023-06-22 DIAGNOSIS — F411 Generalized anxiety disorder: Secondary | ICD-10-CM | POA: Diagnosis not present

## 2023-07-06 DIAGNOSIS — F411 Generalized anxiety disorder: Secondary | ICD-10-CM | POA: Diagnosis not present

## 2023-07-20 DIAGNOSIS — F411 Generalized anxiety disorder: Secondary | ICD-10-CM | POA: Diagnosis not present

## 2023-07-21 DIAGNOSIS — E66811 Obesity, class 1: Secondary | ICD-10-CM | POA: Diagnosis not present

## 2023-07-21 DIAGNOSIS — A084 Viral intestinal infection, unspecified: Secondary | ICD-10-CM | POA: Diagnosis not present

## 2023-07-21 DIAGNOSIS — R197 Diarrhea, unspecified: Secondary | ICD-10-CM | POA: Diagnosis not present

## 2023-07-21 DIAGNOSIS — R112 Nausea with vomiting, unspecified: Secondary | ICD-10-CM | POA: Diagnosis not present

## 2023-08-16 DIAGNOSIS — F411 Generalized anxiety disorder: Secondary | ICD-10-CM | POA: Diagnosis not present

## 2023-09-09 DIAGNOSIS — F411 Generalized anxiety disorder: Secondary | ICD-10-CM | POA: Diagnosis not present

## 2023-10-07 DIAGNOSIS — F411 Generalized anxiety disorder: Secondary | ICD-10-CM | POA: Diagnosis not present

## 2023-10-12 DIAGNOSIS — M542 Cervicalgia: Secondary | ICD-10-CM | POA: Diagnosis not present

## 2023-10-12 DIAGNOSIS — M25511 Pain in right shoulder: Secondary | ICD-10-CM | POA: Diagnosis not present

## 2023-10-12 DIAGNOSIS — Z6834 Body mass index (BMI) 34.0-34.9, adult: Secondary | ICD-10-CM | POA: Diagnosis not present

## 2023-10-20 DIAGNOSIS — Z1231 Encounter for screening mammogram for malignant neoplasm of breast: Secondary | ICD-10-CM | POA: Diagnosis not present

## 2023-10-27 DIAGNOSIS — L0291 Cutaneous abscess, unspecified: Secondary | ICD-10-CM | POA: Diagnosis not present

## 2023-11-08 DIAGNOSIS — F411 Generalized anxiety disorder: Secondary | ICD-10-CM | POA: Diagnosis not present

## 2023-12-01 DIAGNOSIS — E7800 Pure hypercholesterolemia, unspecified: Secondary | ICD-10-CM | POA: Diagnosis not present

## 2023-12-01 DIAGNOSIS — D509 Iron deficiency anemia, unspecified: Secondary | ICD-10-CM | POA: Diagnosis not present

## 2023-12-01 DIAGNOSIS — Z79899 Other long term (current) drug therapy: Secondary | ICD-10-CM | POA: Diagnosis not present

## 2023-12-01 DIAGNOSIS — E559 Vitamin D deficiency, unspecified: Secondary | ICD-10-CM | POA: Diagnosis not present

## 2023-12-01 DIAGNOSIS — E538 Deficiency of other specified B group vitamins: Secondary | ICD-10-CM | POA: Diagnosis not present

## 2023-12-01 DIAGNOSIS — F988 Other specified behavioral and emotional disorders with onset usually occurring in childhood and adolescence: Secondary | ICD-10-CM | POA: Diagnosis not present

## 2023-12-01 DIAGNOSIS — M2550 Pain in unspecified joint: Secondary | ICD-10-CM | POA: Diagnosis not present

## 2023-12-01 DIAGNOSIS — I10 Essential (primary) hypertension: Secondary | ICD-10-CM | POA: Diagnosis not present

## 2023-12-20 DIAGNOSIS — E538 Deficiency of other specified B group vitamins: Secondary | ICD-10-CM | POA: Diagnosis not present

## 2023-12-21 DIAGNOSIS — E88819 Insulin resistance, unspecified: Secondary | ICD-10-CM | POA: Diagnosis not present

## 2023-12-21 DIAGNOSIS — I10 Essential (primary) hypertension: Secondary | ICD-10-CM | POA: Diagnosis not present

## 2023-12-21 DIAGNOSIS — R21 Rash and other nonspecific skin eruption: Secondary | ICD-10-CM | POA: Diagnosis not present

## 2023-12-21 DIAGNOSIS — M3500 Sicca syndrome, unspecified: Secondary | ICD-10-CM | POA: Diagnosis not present

## 2023-12-23 DIAGNOSIS — F411 Generalized anxiety disorder: Secondary | ICD-10-CM | POA: Diagnosis not present

## 2023-12-24 DIAGNOSIS — D509 Iron deficiency anemia, unspecified: Secondary | ICD-10-CM | POA: Diagnosis not present

## 2024-01-04 DIAGNOSIS — D509 Iron deficiency anemia, unspecified: Secondary | ICD-10-CM | POA: Diagnosis not present

## 2024-01-13 DIAGNOSIS — F411 Generalized anxiety disorder: Secondary | ICD-10-CM | POA: Diagnosis not present

## 2024-01-17 DIAGNOSIS — E538 Deficiency of other specified B group vitamins: Secondary | ICD-10-CM | POA: Diagnosis not present

## 2024-02-29 DIAGNOSIS — F988 Other specified behavioral and emotional disorders with onset usually occurring in childhood and adolescence: Secondary | ICD-10-CM | POA: Diagnosis not present

## 2024-02-29 DIAGNOSIS — I10 Essential (primary) hypertension: Secondary | ICD-10-CM | POA: Diagnosis not present

## 2024-02-29 DIAGNOSIS — R7303 Prediabetes: Secondary | ICD-10-CM | POA: Diagnosis not present
